# Patient Record
Sex: Male | Born: 1960 | Race: White | Hispanic: No | Marital: Married | Smoking: Never smoker
Health system: Southern US, Community
[De-identification: ages and names within clinical notes are randomized; demographics above are authoritative.]

## PROBLEM LIST (undated history)

## (undated) DIAGNOSIS — K219 Gastro-esophageal reflux disease without esophagitis: Secondary | ICD-10-CM

## (undated) DIAGNOSIS — Z8709 Personal history of other diseases of the respiratory system: Secondary | ICD-10-CM

## (undated) DIAGNOSIS — R112 Nausea with vomiting, unspecified: Secondary | ICD-10-CM

## (undated) DIAGNOSIS — J189 Pneumonia, unspecified organism: Secondary | ICD-10-CM

## (undated) DIAGNOSIS — Z9889 Other specified postprocedural states: Secondary | ICD-10-CM

## (undated) DIAGNOSIS — E119 Type 2 diabetes mellitus without complications: Secondary | ICD-10-CM

## (undated) HISTORY — DX: Type 2 diabetes mellitus without complications: E11.9

## (undated) HISTORY — PX: LYMPH NODE BIOPSY: SHX201

## (undated) HISTORY — PX: POLYPECTOMY: SHX149

## (undated) HISTORY — PX: KNEE ARTHROPLASTY: SHX992

## (undated) HISTORY — PX: UMBILICAL HERNIA REPAIR: SHX196

---

## 2012-05-18 HISTORY — PX: COLONOSCOPY: SHX174

## 2016-10-26 ENCOUNTER — Other Ambulatory Visit (HOSPITAL_COMMUNITY): Payer: Self-pay | Admitting: Urology

## 2016-10-26 DIAGNOSIS — R1909 Other intra-abdominal and pelvic swelling, mass and lump: Secondary | ICD-10-CM

## 2016-11-06 ENCOUNTER — Other Ambulatory Visit: Payer: Self-pay | Admitting: Radiology

## 2016-11-07 ENCOUNTER — Encounter (INDEPENDENT_AMBULATORY_CARE_PROVIDER_SITE_OTHER): Payer: Self-pay

## 2016-11-07 ENCOUNTER — Encounter (HOSPITAL_COMMUNITY): Payer: Self-pay

## 2016-11-07 ENCOUNTER — Ambulatory Visit (HOSPITAL_COMMUNITY)
Admission: RE | Admit: 2016-11-07 | Discharge: 2016-11-07 | Disposition: A | Discharge status: Home / Self Care | Payer: BC Managed Care – PPO | Source: Ambulatory Visit | Attending: Urology | Admitting: Urology

## 2016-11-07 ENCOUNTER — Encounter (HOSPITAL_COMMUNITY): Payer: Self-pay | Admitting: Radiology

## 2016-11-07 DIAGNOSIS — Z79899 Other long term (current) drug therapy: Secondary | ICD-10-CM | POA: Diagnosis not present

## 2016-11-07 DIAGNOSIS — Z7982 Long term (current) use of aspirin: Secondary | ICD-10-CM | POA: Insufficient documentation

## 2016-11-07 DIAGNOSIS — K219 Gastro-esophageal reflux disease without esophagitis: Secondary | ICD-10-CM | POA: Insufficient documentation

## 2016-11-07 DIAGNOSIS — R1909 Other intra-abdominal and pelvic swelling, mass and lump: Secondary | ICD-10-CM | POA: Diagnosis present

## 2016-11-07 DIAGNOSIS — R7989 Other specified abnormal findings of blood chemistry: Secondary | ICD-10-CM | POA: Insufficient documentation

## 2016-11-07 DIAGNOSIS — N2889 Other specified disorders of kidney and ureter: Secondary | ICD-10-CM | POA: Insufficient documentation

## 2016-11-07 DIAGNOSIS — Z7951 Long term (current) use of inhaled steroids: Secondary | ICD-10-CM | POA: Insufficient documentation

## 2016-11-07 HISTORY — DX: Gastro-esophageal reflux disease without esophagitis: K21.9

## 2016-11-07 LAB — BASIC METABOLIC PANEL
ANION GAP: 8 (ref 5–15)
BUN: 21 mg/dL — AB (ref 6–20)
CHLORIDE: 108 mmol/L (ref 101–111)
CO2: 23 mmol/L (ref 22–32)
Calcium: 9.1 mg/dL (ref 8.9–10.3)
Creatinine, Ser: 0.75 mg/dL (ref 0.61–1.24)
GFR calc Af Amer: >60 mL/min (ref >60)
GFR calc non Af Amer: >60 mL/min (ref >60)
Glucose, Bld: 129 mg/dL — ABNORMAL HIGH (ref 65–99)
POTASSIUM: 3.9 mmol/L (ref 3.5–5.1)
SODIUM: 139 mmol/L (ref 135–145)

## 2016-11-07 LAB — CBC WITH DIFFERENTIAL/PLATELET
Basophils Absolute: 0 10*3/uL (ref 0.0–0.1)
Basophils Relative: 1 %
EOS PCT: 3 %
Eosinophils Absolute: 0.1 10*3/uL (ref 0.0–0.7)
HEMATOCRIT: 42.9 % (ref 39.0–52.0)
HEMOGLOBIN: 15.2 g/dL (ref 13.0–17.0)
LYMPHS ABS: 1.7 10*3/uL (ref 0.7–4.0)
LYMPHS PCT: 31 %
MCH: 29.4 pg (ref 26.0–34.0)
MCHC: 35.4 g/dL (ref 30.0–36.0)
MCV: 83 fL (ref 78.0–100.0)
Monocytes Absolute: 0.5 10*3/uL (ref 0.1–1.0)
Monocytes Relative: 10 %
NEUTROS ABS: 3.1 10*3/uL (ref 1.7–7.7)
NEUTROS PCT: 57 %
Platelets: 222 10*3/uL (ref 150–400)
RBC: 5.17 MIL/uL (ref 4.22–5.81)
RDW: 12.8 % (ref 11.5–15.5)
WBC: 5.4 10*3/uL (ref 4.0–10.5)

## 2016-11-07 LAB — PROTIME-INR
INR: 1.03
Prothrombin Time: 13.5 seconds (ref 11.4–15.2)

## 2016-11-07 MED ORDER — FENTANYL CITRATE (PF) 100 MCG/2ML IJ SOLN
INTRAMUSCULAR | Status: AC
  Filled 2016-11-07: qty 4

## 2016-11-07 MED ORDER — MIDAZOLAM HCL 2 MG/2ML IJ SOLN
INTRAMUSCULAR | Status: AC | PRN
  Administered 2016-11-07 (×3): 1 mg via INTRAVENOUS

## 2016-11-07 MED ORDER — FENTANYL CITRATE (PF) 100 MCG/2ML IJ SOLN
INTRAMUSCULAR | Status: AC | PRN
  Administered 2016-11-07: 50 ug via INTRAVENOUS

## 2016-11-07 MED ORDER — MIDAZOLAM HCL 2 MG/2ML IJ SOLN
INTRAMUSCULAR | Status: AC
  Filled 2016-11-07: qty 6

## 2016-11-07 MED ORDER — MIDAZOLAM HCL 2 MG/2ML IJ SOLN
INTRAMUSCULAR | Status: DC | PRN
  Administered 2016-11-07: 1 mg via INTRAVENOUS

## 2016-11-07 MED ORDER — SODIUM CHLORIDE 0.9 % IV SOLN
INTRAVENOUS | Status: DC
  Administered 2016-11-07: 07:00:00 via INTRAVENOUS

## 2016-11-07 NOTE — Procedures (Signed)
Interventional Radiology Procedure Note  Procedure:  CT guided biopsy of left perirenal mass. Mx 18G core biopsy.  Complications: None Recommendations:  - Ok to shower tomorrow - Do not submerge for 7 days - Routine care   Signed,  Dulcy Fanny. Earleen Newport, DO

## 2016-11-07 NOTE — Consult Note (Signed)
Chief Complaint: Patient was seen in consultation today for CT guided left perirenal mass biopsy   Referring Physician(s): Borden,Lester  Supervising Physician: Corrie Mckusick  Patient Status: [facility] - Out-pt  History of Present Illness: Nicholas Pope is a 55 y.o. male with history of GERD and elevated liver function tests who was found to have left perirenal masses incidentally on abdominal ultrasound and confirmed on subsequent MRI of the abdomen. He presents today for CT-guided biopsy of the largest left perirenal mass for or further evaluation. He has no known history of malignancy.  Past Medical History:  Diagnosis Date  . GERD (gastroesophageal reflux disease)   . Left renal mass     Past Surgical History:  Procedure Laterality Date  . KNEE ARTHROPLASTY Left 1980's  . UMBILICAL HERNIA REPAIR     as a child    Allergies: Patient has no known allergies.  Medications: Prior to Admission medications   Medication Sig Start Date End Date Taking? Authorizing Provider  aspirin 325 MG tablet Take 325 mg by mouth daily.   Yes Historical Provider, MD  fluticasone (FLONASE) 50 MCG/ACT nasal spray Place 2 sprays into both nostrils daily.   Yes Historical Provider, MD  folic acid (FOLVITE) A999333 MCG tablet Take 400 mcg by mouth daily.   Yes Historical Provider, MD  loratadine (CLARITIN) 10 MG tablet Take 10 mg by mouth daily.   Yes Historical Provider, MD  Multiple Vitamin (MULTIVITAMIN WITH MINERALS) TABS tablet Take 1 tablet by mouth daily.   Yes Historical Provider, MD  omeprazole (PRILOSEC) 40 MG capsule Take 40 mg by mouth daily.   Yes Historical Provider, MD     History reviewed. No pertinent family history.  Social History   Social History  . Marital status: Married    Spouse name: N/A  . Number of children: N/A  . Years of education: N/A   Social History Main Topics  . Smoking status: Never Smoker  . Smokeless tobacco: Never Used  . Alcohol use No  . Drug use:  No  . Sexual activity: Not Asked   Other Topics Concern  . None   Social History Narrative  . None      Review of Systems CURRENTLY DENIES FEVER, HEADACHE, CHEST PAIN, DYSPNEA, COUGH, ABDOMINAL/BACK PAIN, NAUSEA, VOMITING, DYSURIA/HEMATURIA, WEIGHT LOSS OR NIGHT SWEATS.  Vital Signs: BP (!) 146/90 (BP Location: Left Arm)   Pulse 72   Temp 97.9 F (36.6 C) (Oral)   Resp 16   Ht 6\' 1"  (1.854 m)   Wt 236 lb (107 kg)   SpO2 98%   BMI 31.14 kg/m   Physical Exam patient awake, alert. Chest clear to auscultation bilaterally. Heart with regular rate and rhythm. Abdomen soft, positive bowel sounds, nontender. Lower extremities with intact pulses and no significant edema.  Mallampati Score:     Imaging: No results found.  Labs:  CBC:  Recent Labs  11/07/16 0705  WBC 5.4  HGB 15.2  HCT 42.9  PLT 222    COAGS:  Recent Labs  11/07/16 0705  INR 1.03    BMP:  Recent Labs  11/07/16 0705  NA 139  K 3.9  CL 108  CO2 23  GLUCOSE 129*  BUN 21*  CALCIUM 9.1  CREATININE 0.75  GFRNONAA >60  GFRAA >60    LIVER FUNCTION TESTS: No results for input(s): BILITOT, AST, ALT, ALKPHOS, PROT, ALBUMIN in the last 8760 hours.  TUMOR MARKERS: No results for input(s): AFPTM, CEA, CA199,  [place] in the last 8760 hours.  Assessment and Plan: 55 y.o. male with history of GERD and elevated liver function tests who was found to have left perirenal masses incidentally on abdominal ultrasound and confirmed on subsequent MRI of the abdomen. He presents today for CT-guided biopsy of the largest left perirenal mass for or further evaluation. He has no known history of malignancy.Risks and benefits discussed with the patient/wife including, but not limited to bleeding, infection, damage to adjacent structures or low yield requiring additional tests.All of the patient's questions were answered, patient is agreeable to proceed.Consent signed and in chart. Patient's fasting CBG today  was 129. We'll have him follow up with primary care physician for further evaluation.      Thank you for this interesting consult.  I greatly enjoyed meeting [name] and look forward to participating in their care.  A copy of this report was sent to the requesting provider on this date.  Electronically Signed: D. Rowe Robert 11/07/2016, 8:31 AM   I spent a total of 20 minutes   in face to face in clinical consultation, greater than 50% of which was counseling/coordinating care for CT guided left perirenal mass biopsy

## 2016-11-07 NOTE — Discharge Instructions (Signed)
Needle Biopsy, Care After Introduction These instructions give you information about caring for yourself after your procedure. Your doctor may also give you more specific instructions. Call your doctor if you have any problems or questions after your procedure. Follow these instructions at home:  Rest as told by your doctor.  Take medicines only as told by your doctor.  There are many different ways to close and cover the biopsy site, including stitches (sutures), skin glue, and adhesive strips. Follow instructions from your doctor about:  How to take care of your biopsy site.  When and how you should change your bandage (dressing).  When you should remove your dressing.  Removing whatever was used to close your biopsy site.  Check your biopsy site every day for signs of infection. Watch for:  Redness, swelling, or pain.  Fluid, blood, or pus. Contact a doctor if:  You have a fever.  You have redness, swelling, or pain at the biopsy site, and it lasts longer than a few days.  You have fluid, blood, or pus coming from the biopsy site.  You feel sick to your stomach (nauseous).  You throw up (vomit). Get help right away if:  You are short of breath.  You have trouble breathing.  Your chest hurts.  You feel dizzy or you pass out (faint).  You have bleeding that does not stop with pressure or a bandage.  You cough up blood.  Your belly (abdomen) hurts. This information is not intended to replace advice given to you by your health care provider. Make sure you discuss any questions you have with your health care provider. Document Released: 11/16/2008 Document Revised: 05/11/2016 Document Reviewed: 11/30/2014  2017 Elsevier Moderate Conscious Sedation, Adult Sedation is the use of medicines to promote relaxation and relieve discomfort and anxiety. Moderate conscious sedation is a type of sedation. Under moderate conscious sedation, you are less alert than normal, but  you are still able to respond to instructions, touch, or both. Moderate conscious sedation is used during short medical and dental procedures. It is milder than deep sedation, which is a type of sedation under which you cannot be easily woken up. It is also milder than general anesthesia, which is the use of medicines to make you unconscious. Moderate conscious sedation allows you to return to your regular activities sooner. Tell a health care provider about:  Any allergies you have.  All medicines you are taking, including vitamins, herbs, eye drops, creams, and over-the-counter medicines.  Use of steroids (by mouth or creams).  Any problems you or family members have had with sedatives and anesthetic medicines.  Any blood disorders you have.  Any surgeries you have had.  Any medical conditions you have, such as sleep apnea.  Whether you are pregnant or may be pregnant.  Any use of cigarettes, alcohol, marijuana, or street drugs. What are the risks? Generally, this is a safe procedure. However, problems may occur, including:  Getting too much medicine (oversedation).  Nausea.  Allergic reaction to medicines.  Trouble breathing. If this happens, a breathing tube may be used to help with breathing. It will be removed when you are awake and breathing on your own.  Heart trouble.  Lung trouble. What happens before the procedure? Staying hydrated  Follow instructions from your health care provider about hydration, which may include:  Up to 2 hours before the procedure - you may continue to drink clear liquids, such as water, clear fruit juice, black coffee, and plain tea.  Eating and drinking restrictions  Follow instructions from your health care provider about eating and drinking, which may include:  8 hours before the procedure - stop eating heavy meals or foods such as meat, fried foods, or fatty foods.  6 hours before the procedure - stop eating light meals or foods, such  as toast or cereal.  6 hours before the procedure - stop drinking milk or drinks that contain milk.  2 hours before the procedure - stop drinking clear liquids. Medicine  Ask your health care provider about:  Changing or stopping your regular medicines. This is especially important if you are taking diabetes medicines or blood thinners.  Taking medicines such as aspirin and ibuprofen. These medicines can thin your blood. Do not take these medicines before your procedure if your health care provider instructs you not to. Tests and exams  You will have a physical exam.  You may have blood tests done to show:  How well your kidneys and liver are working.  How well your blood can clot. General instructions  Plan to have someone take you home from the hospital or clinic.  If you will be going home right after the procedure, plan to have someone with you for 24 hours. What happens during the procedure?  An IV tube will be inserted into one of your veins.  Medicine to help you relax (sedative) will be given through the IV tube.  The medical or dental procedure will be performed. What happens after the procedure?  Your blood pressure, heart rate, breathing rate, and blood oxygen level will be monitored often until the medicines you were given have worn off.  Do not drive for 24 hours. This information is not intended to replace advice given to you by your health care provider. Make sure you discuss any questions you have with your health care provider. Document Released: 08/29/2001 Document Revised: 05/09/2016 Document Reviewed: 03/25/2016 Elsevier Interactive Patient Education  2017 [employer].

## 2016-11-22 ENCOUNTER — Telehealth: Payer: Self-pay | Admitting: *Deleted

## 2016-11-22 NOTE — Telephone Encounter (Signed)
"  This is Nicholas Pope calling for my husband Asaun Brester.  Dr. Alinda Money with [facility] referred him to see a Dr. Alen Blew over a week ago and we have not heard anything yet."  No referral received yet per new patient coordinator.  Advised to contact referring provider.

## 2016-11-23 ENCOUNTER — Telehealth: Payer: Self-pay | Admitting: Oncology

## 2016-11-23 ENCOUNTER — Ambulatory Visit (HOSPITAL_BASED_OUTPATIENT_CLINIC_OR_DEPARTMENT_OTHER): Payer: BC Managed Care – PPO | Admitting: Oncology

## 2016-11-23 VITALS — BP 142/91 | HR 80 | Temp 97.9°F | Resp 16 | Ht 73.0 in | Wt 244.8 lb

## 2016-11-23 DIAGNOSIS — C8590 Non-Hodgkin lymphoma, unspecified, unspecified site: Secondary | ICD-10-CM | POA: Diagnosis not present

## 2016-11-23 NOTE — Progress Notes (Signed)
Reason for Referral: Perinephric mass. Lymphoma.   HPI: 55 year old gentleman without any significant comorbid conditions other than hyperlipidemia. He was noted to have elevated liver enzymes on a routine physical by his primary care physician. Based on these findings he underwent an ultrasound of the abdomen on 10/09/2016. The ultrasound showed fatty infiltration of the liver as well as a 1.1 x 1.0 x 1.2 cm complex cyst in the left kidney and MRI was recommended. MRI of the abdomen was obtained on 10/16/2016 which showed a 10 mm cyst along the left kidney there is also 1.7 x 2.3 enhancing perirenal soft tissue lesion along the lateral interpolar aspect of the left kidney. 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 He was evaluated by Dr. Alinda Money and a biopsy was obtained on 11/07/2016. CT-guided biopsy showed atypical lymphoid proliferation that is suspicious for lymphoma. Immunohistochemical stains performed which showed B cells that stain for CD20 and CD79a. There is no core expression of CD5. CD10 patchy positivity. No cyclin D positivity noted. 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 But the material is limited and did not show definitive monoclonality by flow cytometry. Based on these findings patient referred to me for evaluation.  Clinically, he is completely asymptomatic. He denied any fevers or chills. He denied any early satiety or abdominal pain. He denied any constitutional symptoms of fatigue or tiredness. He remains very active and continues to work full time.  He does not report any headaches, blurry vision, syncope or seizures. He does not report any fevers, chills, sweats or weight loss. He does not report any chest pain, palpitation, orthopnea or leg edema. He does not report any cough, wheezing or hemoptysis. He is not reporting nausea, vomiting, abdominal pain, constipation, diarrhea or change in his bowel habits. He does  not report any frequency, urgency or hesitancy. He does report very mild nocturia. He does not report any lymphadenopathy or petechiae. He does not report any skeletal complaints. Remaining review of systems unremarkable.   Past Medical History:  Diagnosis Date  . GERD (gastroesophageal reflux disease)   :  Past Surgical History:  Procedure Laterality Date  . KNEE ARTHROPLASTY Left 1980's  . UMBILICAL HERNIA REPAIR     as a child  :   Current Outpatient Prescriptions:  .  aspirin 325 MG tablet, Take 325 mg by mouth daily., Disp: , Rfl:  .  fluticasone (FLONASE) 50 MCG/ACT nasal spray, Place 2 sprays into both nostrils daily., Disp: , Rfl:  .  folic acid (FOLVITE) A999333 MCG tablet, Take 400 mcg by mouth daily., Disp: , Rfl:  .  loratadine (CLARITIN) 10 MG tablet, Take 10 mg by mouth daily., Disp: , Rfl:  .  Multiple Vitamin (MULTIVITAMIN WITH MINERALS) TABS tablet, Take 1 tablet by mouth daily., Disp: , Rfl:  .  omeprazole (PRILOSEC) 40 MG capsule, Take 40 mg by mouth daily., Disp: , Rfl: :  No Known Allergies:  No family history on file.:  Social History   Social History  . Marital status: Married    Spouse name: N/A  . Number of children: N/A  . Years of education: N/A   Occupational History  . Not on file.   Social History Main Topics  . Smoking status: Never Smoker  . Smokeless tobacco: Never Used  . Alcohol use No  . Drug use: No  . Sexual activity: Not on file   Other Topics Concern  . Not on file   Social History  Narrative  . No narrative on file  :  Pertinent items are noted in HPI.  Exam: Blood pressure (!) 142/91, pulse 80, temperature 97.9 F (36.6 C), temperature source Oral, resp. rate 16, height 6\' 1"  (1.854 m), weight 244 lb 12.8 oz (111 kg), SpO2 100 %. General appearance: alert and cooperative Head: Normocephalic, without obvious abnormality Neck: no adenopathy Back: negative Resp: clear to auscultation bilaterally Chest wall: no  tenderness Cardio: regular rate and rhythm, S1, S2 normal, no murmur, click, rub or gallop GI: soft, non-tender; bowel sounds normal; no masses,  no organomegaly Extremities: extremities normal, atraumatic, no cyanosis or edema Pulses: 2+ and symmetric  CBC    Component Value Date/Time   WBC 5.4 11/07/2016 0705   RBC 5.17 11/07/2016 0705   HGB 15.2 11/07/2016 0705   HCT 42.9 11/07/2016 0705   PLT 222 11/07/2016 0705   MCV 83.0 11/07/2016 0705   MCH 29.4 11/07/2016 0705   MCHC 35.4 11/07/2016 0705   RDW 12.8 11/07/2016 0705   LYMPHSABS 1.7 11/07/2016 0705   MONOABS 0.5 11/07/2016 0705   EOSABS 0.1 11/07/2016 0705   BASOSABS 0.0 11/07/2016 0705     Chemistry      Component Value Date/Time   NA 139 11/07/2016 0705   K 3.9 11/07/2016 0705   CL 108 11/07/2016 0705   CO2 23 11/07/2016 0705   BUN 21 (H) 11/07/2016 0705   CREATININE 0.75 11/07/2016 0705      Component Value Date/Time   CALCIUM 9.1 11/07/2016 0705         Assessment and Plan:    55 year old woman with the following issues:  1. 1.7 x 2.3 enhancing perirenal soft tissue lesion diagnosed in October 2017. He is status post biopsy done on 11/07/2016 which showed atypical lymphoid proliferation suspicious for lymphoma. The immunohistochemical staining suggest marginal zone lymphoma although there is no monoclonality by flow cytometry. Or tissue is recommended to evaluate the process.  These findings were discussed with the patient today in detail with his family. The differential diagnosis would include reactive process. Reactive lymphoid proliferation versus a neoplastic process such as lymphoma. High-grade lymphoma is considered extremely unlikely given the lack of symptoms and no evidence of any abnormalities noted on his imaging studies.  From a management standpoint, I have recommended a full body imaging studies including a PET scan to complete the staging process. Ideally, I would like to obtain a tissue  biopsy for additional characterization of this process. That would potentially include an excisional lymph node biopsy if his PET scan show any additional lymphadenopathy. Alternatively, surgical removal of the perinephritic mass could also be considered as an option if no other lesions are noted. Observation and surveillance and radiology surveillance could also be an option if he remains completely asymptomatic and no other lesions are noted at that time. He will follow up after his PET scan to discuss the results and the next steps of management.  2. Increase in his liver function tests: This believes to be unrelated to his perinephric mass and likely related to statin drugs. Repeat liver function tests will be needed in the future to ensure that. He has discontinued statin at this time.  3. Follow-up: Will be in the next couple weeks after imaging studies.

## 2016-11-23 NOTE — Telephone Encounter (Signed)
Left message re 12/21 f/u. Central radiology will call re scan. Schedule mailed.

## 2016-12-05 ENCOUNTER — Ambulatory Visit (HOSPITAL_COMMUNITY)
Admission: RE | Admit: 2016-12-05 | Discharge: 2016-12-05 | Disposition: A | Discharge status: Home / Self Care | Payer: BC Managed Care – PPO | Source: Ambulatory Visit | Attending: Oncology | Admitting: Oncology

## 2016-12-05 DIAGNOSIS — C8590 Non-Hodgkin lymphoma, unspecified, unspecified site: Secondary | ICD-10-CM | POA: Insufficient documentation

## 2016-12-05 DIAGNOSIS — R911 Solitary pulmonary nodule: Secondary | ICD-10-CM | POA: Diagnosis not present

## 2016-12-05 LAB — GLUCOSE, CAPILLARY: Glucose-Capillary: 116 mg/dL — ABNORMAL HIGH (ref 65–99)

## 2016-12-05 MED ORDER — FLUDEOXYGLUCOSE F - 18 (FDG) INJECTION
12.0400 | Freq: Once | INTRAVENOUS | Status: AC | PRN
  Administered 2016-12-05: 12.04 via INTRAVENOUS

## 2016-12-07 ENCOUNTER — Ambulatory Visit (HOSPITAL_BASED_OUTPATIENT_CLINIC_OR_DEPARTMENT_OTHER): Payer: BC Managed Care – PPO | Admitting: Oncology

## 2016-12-07 ENCOUNTER — Telehealth: Payer: Self-pay | Admitting: Oncology

## 2016-12-07 VITALS — BP 164/86 | HR 89 | Temp 97.8°F | Resp 18 | Ht 73.0 in | Wt 242.8 lb

## 2016-12-07 DIAGNOSIS — N2889 Other specified disorders of kidney and ureter: Secondary | ICD-10-CM | POA: Diagnosis not present

## 2016-12-07 NOTE — Telephone Encounter (Signed)
Gave patient avs report and appointments for January  °

## 2016-12-07 NOTE — Progress Notes (Signed)
Hematology and Oncology Follow Up Visit  Nicholas Pope [id number] November 30, 1961 55 y.o. 12/07/2016 4:32 PM Nicholas Pope, MDBurdine, Nicholas Evener, MD   Principle Diagnosis: 56 year old gentleman with 1.7 x 2.3 cm mass in the pararenal space detected in October 2017. Workup remains ongoing.   Prior Therapy: Status post needle biopsy done on 11/07/2016 which showed atypical lymphoid proliferation suspicious for lymphoma. The immunohistochemical staining suggest marginal zone lymphoma although there is no monoclonality by flow cytometry. More tissue is recommended to evaluate the process.  Current therapy: Under evaluation for further therapy.  Interim History: Nicholas Pope presents today for a follow-up visit. Since the last visit, he underwent a PET scan and has not reported any changes in his health. He denied any fevers or chills or sweats. He does not report any cough or abdominal pain. He denied any flank pain or hematuria. He does not report any changes in his performance status or activity level. He is completely asymptomatic at this time.  He does not report any headaches, blurry vision, syncope or seizures. He does not report any fevers, chills, sweats or weight loss. He does not report any chest pain, palpitation, orthopnea or leg edema. He does not report any cough, wheezing or hemoptysis. He is not reporting nausea, vomiting, abdominal pain, constipation, diarrhea or change in his bowel habits. He does not report any frequency, urgency or hesitancy. He does report very mild nocturia. He does not report any lymphadenopathy or petechiae. He does not report any skeletal complaints. Remaining review of systems unremarkable.   Medications: I have reviewed the patient's current medications.  Current Outpatient Prescriptions  Medication Sig Dispense Refill  . aspirin 325 MG tablet Take 325 mg by mouth daily.    . fluticasone (FLONASE) 50 MCG/ACT nasal spray Place 2 sprays into both nostrils  daily.    . folic acid (FOLVITE) A999333 MCG tablet Take 400 mcg by mouth daily.    Marland Kitchen loratadine (CLARITIN) 10 MG tablet Take 10 mg by mouth daily.    . Multiple Vitamin (MULTIVITAMIN WITH MINERALS) TABS tablet Take 1 tablet by mouth daily.    Marland Kitchen omeprazole (PRILOSEC) 40 MG capsule Take 40 mg by mouth daily.     No current facility-administered medications for this visit.      Allergies: No Known Allergies  Past Medical History, Surgical history, Social history, and Family History were reviewed and updated.   Physical Exam: Blood pressure (!) 164/86, pulse 89, temperature 97.8 F (36.6 C), temperature source Oral, resp. rate 18, height 6\' 1"  (1.854 m), weight 242 lb 12.8 oz (110.1 kg), SpO2 97 %. ECOG: 0 General appearance: alert and cooperative Head: Normocephalic, without obvious abnormality Neck: no adenopathy Lymph nodes: Cervical, supraclavicular, and axillary nodes normal. Heart:regular rate and rhythm, S1, S2 normal, no murmur, click, rub or gallop Lung:chest clear, no wheezing, rales, normal symmetric air entry Abdomin: soft, non-tender, without masses or organomegaly EXT:no erythema, induration, or nodules   Lab Results: Lab Results  Component Value Date   WBC 5.4 11/07/2016   HGB 15.2 11/07/2016   HCT 42.9 11/07/2016   MCV 83.0 11/07/2016   PLT 222 11/07/2016     Chemistry      Component Value Date/Time   NA 139 11/07/2016 0705   K 3.9 11/07/2016 0705   CL 108 11/07/2016 0705   CO2 23 11/07/2016 0705   BUN 21 (H) 11/07/2016 0705   CREATININE 0.75 11/07/2016 0705      Component Value Date/Time   CALCIUM  9.1 11/07/2016 0705     EXAM: NUCLEAR MEDICINE PET SKULL BASE TO THIGH  TECHNIQUE: 6.5 mCi F-18 FDG was injected intravenously. Full-ring PET imaging was performed from the skull base to thigh after the radiotracer. CT data was obtained and used for attenuation correction and anatomic localization.  FASTING BLOOD GLUCOSE:  Value: 116  mg/dl  COMPARISON:  MRI 10/16/2016  FINDINGS: NECK  No hypermetabolic lymph nodes in the neck.  CHEST  LEFT lower lobe 4 mm subpleural nodule (image 49, series 7). No associated metabolic activity. No hypermetabolic or enlarged mediastinal or axillary lymph nodes.  ABDOMEN/PELVIS  The soft tissue in the LEFT perirenal space is intensely metabolic with SUV max equal 8.3. Nodular lesion measures 18 x 17 mm which is not changed in size from 18 by 18 mm on comparison exam. There is a second hypermetabolic nodule inferior to the lower pole of the LEFT kidney measuring 12 mm (image 142, series 4). This lesion is also hypermetabolic with SUV max equal 4.8.  Small lesion in the RIGHT pararenal space measures 6 mm (141, series 4) without associated metabolic activity.  No additional all abnormal metabolic activity the retroperitoneum. No hypermetabolic retroperitoneal or iliac or inguinal lymph nodes.  No abnormal activity in the spleen, adrenal glands or liver.  SKELETON  No focal hypermetabolic activity to suggest skeletal metastasis.  IMPRESSION: 1. Hypermetabolic soft tissue in the left perirenal space is consistent with lymphoma. 2. Second small nodule in the retroperitoneum inferior to the lower pole of the LEFT kidney is consistent with lymphoma. 3. Small round lesion adjacent to the RIGHT kidney is not hypermetabolic. Recommend attention on follow-up. 4. Small LEFT lower lobe pulmonary nodule is likely benign. Attention on follow-up. 5. No hypermetabolic or enlarged abdominal, iliac or pelvic lymph nodes. No thoracic adenopathy or neck adenopathy.  Impression and Plan:   55 year old woman with the following issues:  1. 1.7 x 2.3 enhancing perirenal soft tissue lesion diagnosed in October 2017. He is status post biopsy done on 11/07/2016 which showed atypical lymphoid proliferation suspicious for lymphoma. The immunohistochemical staining suggest  marginal zone lymphoma although there is no monoclonality by flow cytometry.   His PET/CT scan didn't show hypermetabolic soft tissue mass and the left perirenal space with a second small nodule that are suspicious for lymphoma. No other lymphadenopathy noted anywhere else.  Options of therapy were reviewed today which include continued observation and surveillance and repeat imaging studies in 3-6 months versus obtaining more tissues. These findings are highly suspicious for lymphoma although the needle biopsy was inadequate to fully characterize this process. In order to get more tissue, he will require an excisional biopsy. In order to do so, I will need to refer him to Dr. Alinda Money for a laparoscopic excisional biopsy at that time.  After discussing the risks and benefits of both approaches he is in favor of obtaining tissue in the immediate future. I will refer him to Dr. Alinda Money for evaluation to hopefully to have that done in the next few weeks.  Once this process is fully identified and characterized treatment options will be outlined at that point.  2. Follow-up: Will be next few weeks after tissue biopsy obtained.   Zola Button, MD 12/21/20174:32 PM

## 2016-12-14 ENCOUNTER — Encounter: Payer: Self-pay | Admitting: Oncology

## 2016-12-15 ENCOUNTER — Telehealth: Payer: Self-pay | Admitting: Oncology

## 2016-12-15 NOTE — Telephone Encounter (Signed)
Faxed office note to [facility]

## 2016-12-18 DIAGNOSIS — C801 Malignant (primary) neoplasm, unspecified: Secondary | ICD-10-CM

## 2016-12-18 HISTORY — DX: Malignant (primary) neoplasm, unspecified: C80.1

## 2016-12-19 ENCOUNTER — Other Ambulatory Visit: Payer: Self-pay | Admitting: Urology

## 2016-12-19 NOTE — Progress Notes (Signed)
Scheduling pre op--please PLACE SURGICAL ORDERS IN EPIC  THANKS

## 2016-12-20 ENCOUNTER — Other Ambulatory Visit: Payer: Self-pay | Admitting: Urology

## 2016-12-29 NOTE — Patient Instructions (Signed)
Nicholas Pope  12/29/2016   Your procedure is scheduled on: 01/08/2017    Report to [facility]  Entrance take [place]  elevators to 3rd floor to  [place] at   [place] AM.  Call this number if you have problems the morning of surgery [phone]   Remember: ONLY 1 PERSON MAY GO WITH YOU TO SHORT STAY TO GET  READY MORNING OF YOUR SURGERY.  Do not eat food or drink liquids :After Midnight.     Take these medicines the morning of surgery with A SIP OF WATER: Flonase, Claritin                                 You may not have any metal on your body including hair pins and              piercings  Do not wear jewelry,  lotions, powders or perfumes, deodorant                        Men may shave face and neck.   Do not bring valuables to the hospital. [place].  Contacts, dentures or bridgework may not be worn into surgery.  Leave suitcase in the car. After surgery it may be brought to your room.       Special Instructions: N/A              Please read over the following fact sheets you were given: _____________________________________________________________________                CLEAR LIQUID DIET   Foods Allowed                                                                     Foods Excluded  Coffee and tea, regular and decaf                             liquids that you cannot  Plain Jell-O in any flavor                                             see through such as: Fruit ices (not with fruit pulp)                                     milk, soups, orange juice  Iced Popsicles                                    All solid food Carbonated beverages, regular and diet  Cranberry, grape and apple juices Sports drinks like Gatorade Lightly seasoned clear broth or consume(fat free) Sugar, honey syrup  Sample Menu Breakfast                                 Lunch                                     Supper Cranberry juice                    Beef broth                            Chicken broth Jell-O                                     Grape juice                           Apple juice Coffee or tea                        Jell-O                                      Popsicle                                                Coffee or tea                        Coffee or tea  _____________________________________________________________________  [facility] - Preparing for Surgery Before surgery, you can play an important role.  Because skin is not sterile, your skin needs to be as free of germs as possible.  You can reduce the number of germs on your skin by washing with CHG (chlorahexidine gluconate) soap before surgery.  CHG is an antiseptic cleaner which kills germs and bonds with the skin to continue killing germs even after washing. Please DO NOT use if you have an allergy to CHG or antibacterial soaps.  If your skin becomes reddened/irritated stop using the CHG and inform your nurse when you arrive at Short Stay. Do not shave (including legs and underarms) for at least 48 hours prior to the first CHG shower.  You may shave your face/neck. Please follow these instructions carefully:  1.  Shower with CHG Soap the night before surgery and the  morning of Surgery.  2.  If you choose to wash your hair, wash your hair first as usual with your  normal  shampoo.  3.  After you shampoo, rinse your hair and body thoroughly to remove the  shampoo.                           4.  Use CHG as you would any other liquid soap.  You can apply chg directly  to the skin and wash  Gently with a scrungie or clean washcloth.  5.  Apply the CHG Soap to your body ONLY FROM THE NECK DOWN.   Do not use on face/ open                           Wound or open sores. Avoid contact with eyes, ears mouth and genitals (private parts).                       Wash face,   Genitals (private parts) with your normal soap.             6.  Wash thoroughly, paying special attention to the area where your surgery  will be performed.  7.  Thoroughly rinse your body with warm water from the neck down.  8.  DO NOT shower/wash with your normal soap after using and rinsing off  the CHG Soap.                9.  Pat yourself dry with a clean towel.            10.  Wear clean pajamas.            11.  Place clean sheets on your bed the night of your first shower and do not  sleep with pets. Day of Surgery : Do not apply any lotions/deodorants the morning of surgery.  Please wear clean clothes to the hospital/surgery center.  FAILURE TO FOLLOW THESE INSTRUCTIONS MAY RESULT IN THE CANCELLATION OF YOUR SURGERY PATIENT SIGNATURE_________________________________  NURSE SIGNATURE__________________________________  ________________________________________________________________________  WHAT IS A BLOOD TRANSFUSION? Blood Transfusion Information  A transfusion is the replacement of blood or some of its parts. Blood is made up of multiple cells which provide different functions.  Red blood cells carry oxygen and are used for blood loss replacement.  White blood cells fight against infection.  Platelets control bleeding.  Plasma helps clot blood.  Other blood products are available for specialized needs, such as hemophilia or other clotting disorders. BEFORE THE TRANSFUSION  Who gives blood for transfusions?   Healthy volunteers who are fully evaluated to make sure their blood is safe. This is blood bank blood. Transfusion therapy is the safest it has ever been in the practice of medicine. Before blood is taken from a donor, a complete history is taken to make sure that person has no history of diseases nor engages in risky social behavior (examples are intravenous drug use or sexual activity with multiple partners). The donor's travel history is screened to minimize risk  of transmitting infections, such as malaria. The donated blood is tested for signs of infectious diseases, such as HIV and hepatitis. The blood is then tested to be sure it is compatible with you in order to minimize the chance of a transfusion reaction. If you or a relative donates blood, this is often done in anticipation of surgery and is not appropriate for emergency situations. It takes many days to process the donated blood. RISKS AND COMPLICATIONS Although transfusion therapy is very safe and saves many lives, the main dangers of transfusion include:   Getting an infectious disease.  Developing a transfusion reaction. This is an allergic reaction to something in the blood you were given. Every precaution is taken to prevent this. The decision to have a blood transfusion has been considered carefully by your caregiver before blood is given. Blood is not given unless the benefits  outweigh the risks. AFTER THE TRANSFUSION  Right after receiving a blood transfusion, you will usually feel much better and more energetic. This is especially true if your red blood cells have gotten low (anemic). The transfusion raises the level of the red blood cells which carry oxygen, and this usually causes an energy increase.  The nurse administering the transfusion will monitor you carefully for complications. HOME CARE INSTRUCTIONS  No special instructions are needed after a transfusion. You may find your energy is better. Speak with your caregiver about any limitations on activity for underlying diseases you may have. SEEK MEDICAL CARE IF:   Your condition is not improving after your transfusion.  You develop redness or irritation at the intravenous (IV) site. SEEK IMMEDIATE MEDICAL CARE IF:  Any of the following symptoms occur over the next 12 hours:  Shaking chills.  You have a temperature by mouth above 102 F (38.9 C), not controlled by medicine.  Chest, back, or muscle pain.  People around  you feel you are not acting correctly or are confused.  Shortness of breath or difficulty breathing.  Dizziness and fainting.  You get a rash or develop hives.  You have a decrease in urine output.  Your urine turns a dark color or changes to pink, red, or brown. Any of the following symptoms occur over the next 10 days:  You have a temperature by mouth above 102 F (38.9 C), not controlled by medicine.  Shortness of breath.  Weakness after normal activity.  The white part of the eye turns yellow (jaundice).  You have a decrease in the amount of urine or are urinating less often.  Your urine turns a dark color or changes to pink, red, or brown. Document Released: 12/01/2000 Document Revised: 02/26/2012 Document Reviewed: 07/20/2008 ExitCare Patient Information 2014 [place].  _______________________________________________________________________  Incentive Spirometer  An incentive spirometer is a tool that can help keep your lungs clear and active. This tool measures how well you are filling your lungs with each breath. Taking long deep breaths may help reverse or decrease the chance of developing breathing (pulmonary) problems (especially infection) following:  A long period of time when you are unable to move or be active. BEFORE THE PROCEDURE   If the spirometer includes an indicator to show your best effort, your nurse or respiratory therapist will set it to a desired goal.  If possible, sit up straight or lean slightly forward. Try not to slouch.  Hold the incentive spirometer in an upright position. INSTRUCTIONS FOR USE  1. Sit on the edge of your bed if possible, or sit up as far as you can in bed or on a chair. 2. Hold the incentive spirometer in an upright position. 3. Breathe out normally. 4. Place the mouthpiece in your mouth and seal your lips tightly around it. 5. Breathe in slowly and as deeply as possible, raising the piston or the ball toward the  top of the column. 6. Hold your breath for 3-5 seconds or for as long as possible. Allow the piston or ball to fall to the bottom of the column. 7. Remove the mouthpiece from your mouth and breathe out normally. 8. Rest for a few seconds and repeat Steps 1 through 7 at least 10 times every 1-2 hours when you are awake. Take your time and take a few normal breaths between deep breaths. 9. The spirometer may include an indicator to show your best effort. Use the indicator as a goal to  work toward during each repetition. 10. After each set of 10 deep breaths, practice coughing to be sure your lungs are clear. If you have an incision (the cut made at the time of surgery), support your incision when coughing by placing a pillow or rolled up towels firmly against it. Once you are able to get out of bed, walk around indoors and cough well. You may stop using the incentive spirometer when instructed by your caregiver.  RISKS AND COMPLICATIONS  Take your time so you do not get dizzy or light-headed.  If you are in pain, you may need to take or ask for pain medication before doing incentive spirometry. It is harder to take a deep breath if you are having pain. AFTER USE  Rest and breathe slowly and easily.  It can be helpful to keep track of a log of your progress. Your caregiver can provide you with a simple table to help with this. If you are using the spirometer at home, follow these instructions: [place] IF:   You are having difficultly using the spirometer.  You have trouble using the spirometer as often as instructed.  Your pain medication is not giving enough relief while using the spirometer.  You develop fever of 100.5 F (38.1 C) or higher. SEEK IMMEDIATE MEDICAL CARE IF:   You cough up bloody sputum that had not been present before.  You develop fever of 102 F (38.9 C) or greater.  You develop worsening pain at or near the incision site. MAKE SURE YOU:   Understand  these instructions.  Will watch your condition.  Will get help right away if you are not doing well or get worse. Document Released: 04/16/2007 Document Revised: 02/26/2012 Document Reviewed: 06/17/2007 [facility] Patient Information 2014 [place], [place].   ________________________________________________________________________

## 2017-01-01 ENCOUNTER — Encounter (HOSPITAL_COMMUNITY)
Admission: RE | Admit: 2017-01-01 | Discharge: 2017-01-01 | Disposition: A | Discharge status: Home / Self Care | Payer: BC Managed Care – PPO | Source: Ambulatory Visit | Attending: Urology | Admitting: Urology

## 2017-01-01 ENCOUNTER — Encounter (HOSPITAL_COMMUNITY): Payer: Self-pay

## 2017-01-01 ENCOUNTER — Ambulatory Visit (HOSPITAL_COMMUNITY)
Admission: RE | Admit: 2017-01-01 | Discharge: 2017-01-01 | Disposition: A | Discharge status: Home / Self Care | Payer: BC Managed Care – PPO | Source: Ambulatory Visit | Attending: Urology | Admitting: Urology

## 2017-01-01 DIAGNOSIS — Z01818 Encounter for other preprocedural examination: Secondary | ICD-10-CM

## 2017-01-01 DIAGNOSIS — N2889 Other specified disorders of kidney and ureter: Secondary | ICD-10-CM | POA: Insufficient documentation

## 2017-01-01 DIAGNOSIS — Z01812 Encounter for preprocedural laboratory examination: Secondary | ICD-10-CM | POA: Insufficient documentation

## 2017-01-01 DIAGNOSIS — Z01810 Encounter for preprocedural cardiovascular examination: Secondary | ICD-10-CM | POA: Diagnosis present

## 2017-01-01 HISTORY — DX: Other specified postprocedural states: Z98.890

## 2017-01-01 HISTORY — DX: Pneumonia, unspecified organism: J18.9

## 2017-01-01 HISTORY — DX: Nausea with vomiting, unspecified: R11.2

## 2017-01-01 LAB — BASIC METABOLIC PANEL
ANION GAP: 8 (ref 5–15)
BUN: 14 mg/dL (ref 6–20)
CALCIUM: 8.8 mg/dL — AB (ref 8.9–10.3)
CO2: 24 mmol/L (ref 22–32)
CREATININE: 0.75 mg/dL (ref 0.61–1.24)
Chloride: 106 mmol/L (ref 101–111)
GLUCOSE: 124 mg/dL — AB (ref 65–99)
Potassium: 4.4 mmol/L (ref 3.5–5.1)
Sodium: 138 mmol/L (ref 135–145)

## 2017-01-01 LAB — CBC
HCT: 42.8 % (ref 39.0–52.0)
HEMOGLOBIN: 14.9 g/dL (ref 13.0–17.0)
MCH: 29 pg (ref 26.0–34.0)
MCHC: 34.8 g/dL (ref 30.0–36.0)
MCV: 83.4 fL (ref 78.0–100.0)
PLATELETS: 216 10*3/uL (ref 150–400)
RBC: 5.13 MIL/uL (ref 4.22–5.81)
RDW: 12.8 % (ref 11.5–15.5)
WBC: 5.3 10*3/uL (ref 4.0–10.5)

## 2017-01-01 LAB — ABO/RH: ABO/RH(D): O POS

## 2017-01-04 NOTE — H&P (Signed)
Office Visit Report     12/29/2016   --------------------------------------------------------------------------------   Nicholas Pope  MRN: [id number]  PRIMARY CARE:    DOB: 10/09/1961, 56 year old Male  REFERRING:  Judd Lien, MD  SSN: -[id number] [id number]  PROVIDER:  Raynelle Bring, M.D.    LOCATION:  [facility] [phone]   --------------------------------------------------------------------------------   CC/HPI: Left perirenal mass   Nicholas Pope follows up today in preparation for undergoing a surgical excisional biopsy of his left perirenal mass. 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 However, the tissue was insufficient to make a definitive diagnosis and to direct therapy. I have therefore been requested by Dr. Alen Blew to proceed with an excisional biopsy to aid him in making a definitive diagnosis. Nicholas Pope remains in excellent health and denies any new health problems since his last visit.     ALLERGIES: No Known Drug Allergies    MEDICATIONS: Aspirin 325 mg tablet  Omeprazole 40 mg capsule,delayed release  Claritin 10 mg tablet  Fluticasone Propionate 50 mcg/actuation spray, suspension  Folic Acid  Multivitamins     GU PSH: None     PSH Notes: knee surgery   NON-GU PSH: Hernia Repair    GU PMH: None     PMH Notes:   1) Left peri-renal mass: Nicholas Pope is a 56 year old gentleman who was initially seen in consultation in November 2017 at the request of Dr. Judd Lien for a left perirenal mass. He was found to have elevated liver function tests during a routine physical exam and underwent an ultrasound of the abdomen for further evaluation. This incidentally detected a complex left renal mass in the upper pole of the left kidney measuring 1 cm. For further evaluation, he underwent a dedicated MRI of the abdomen with and without IV contrast. This confirmed the upper pole left renal mass  to be consistent with a simple renal cyst. However, he was also noted to have an enhancing 2.3 x 1.7 cm mass in the left perirenal space. In addition, there was a separate 8 mm more superiorly located soft tissue nodule in the perinephric space as well. There does appear to be a clear plane between this mass and the renal capsule on MR suggesting that this is not a primary renal mass. The mass also has fairly non-distinct borders which would be atypical for a primary renal malignancy. He underwent a percutaneous biopsy that demonstrated findings for probable lymphoma but were inconclusive. He was seen by Dr. Alen Blew and he recommended an excisional biopsy.   He has no family history of kidney cancer. He has no personal history of GU malignancy. He denies any hematuria, history of UTI/STDs, GU malignancy/trauma/surgery.    NON-GU PMH: Other intra-abdominal and pelvic swelling, mass and lump - 10/26/2016 GERD Hypercholesterolemia    FAMILY HISTORY: 1 son - Other Death of family member - Mother Hematuria - Mother, Father Kidney Stones - Mother Prostate disorder - Father   SOCIAL HISTORY: Marital Status: Married Current Smoking Status: Patient has never smoked.  Uses smokeless tobacco. Has never drank.  Drinks 1 caffeinated drink per day. 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    REVIEW OF SYSTEMS:    GU Review Male:   Patient reports get up at night to urinate. Patient denies hard to postpone urination, stream starts and stops, burning/ pain with urination, frequent urination, trouble starting your streams, have to strain to urinate , and leakage of urine.  Gastrointestinal (Lower):   Patient denies diarrhea and constipation.  Gastrointestinal (Upper):   Patient denies nausea and vomiting.  Constitutional:   Patient denies fever, night sweats, weight loss, and fatigue.  Skin:   Patient denies skin rash/ lesion and itching.  Eyes:   Patient denies blurred vision and double vision.  Ears/  Nose/ Throat:   Patient denies sore throat and sinus problems.  Hematologic/Lymphatic:   Patient denies swollen glands and easy bruising.  Cardiovascular:   Patient denies leg swelling and chest pains.  Respiratory:   Patient denies cough and shortness of breath.  Endocrine:   Patient denies excessive thirst.  Musculoskeletal:   Patient denies back pain and joint pain.  Neurological:   Patient denies headaches and dizziness.  Psychologic:   Patient denies depression and anxiety.   VITAL SIGNS:      12/29/2016 04:13 PM  Weight 240 lb / 108.86 kg  Height 73 in / 185.42 cm  BP 130/86 mmHg  Pulse 102 /min  BMI 31.7 kg/m   MULTI-SYSTEM PHYSICAL EXAMINATION:    Constitutional: Well-nourished. No physical deformities. Normally developed. Good grooming.  Neck: Neck symmetrical, not swollen. Normal tracheal position.  Respiratory: No labored breathing, no use of accessory muscles. Clear bilaterally.  Cardiovascular: Normal temperature, normal extremity pulses, no swelling, no varicosities. Regular rate and rhythm.  Lymphatic: No enlargement of neck, axillae, groin.  Skin: No paleness, no jaundice, no cyanosis. No lesion, no ulcer, no rash.  Neurologic / Psychiatric: Oriented to time, oriented to place, oriented to person. No depression, no anxiety, no agitation.  Gastrointestinal: No mass, no tenderness, no rigidity, non obese abdomen.  Eyes: Normal conjunctivae. Normal eyelids.  Ears, Nose, Mouth, and Throat: Left ear no scars, no lesions, no masses. Right ear no scars, no lesions, no masses. Nose no scars, no lesions, no masses. Normal hearing. Normal lips.  Musculoskeletal: Normal gait and station of head and neck.     PAST DATA REVIEWED:  Source Of History:  Patient  Records Review:   Previous Patient Records  Urine Test Review:   Urinalysis   PROCEDURES:          Urinalysis Dipstick Dipstick Cont'd  Color: Yellow Bilirubin: Neg  Appearance: Clear Ketones: Neg  Specific Gravity:  1.025 Blood: Neg  pH: 5.5 Protein: Neg  Glucose: Neg Urobilinogen: 0.2    Nitrites: Neg    Leukocyte Esterase: Neg    ASSESSMENT:      ICD-10 Details  1 NON-GU:   Other intra-abdominal and pelvic swelling, mass and lump - R19.09    PLAN:           Schedule Return Visit/Planned Activity: Keep Scheduled Appointment          Document Letter(s):  Created for Patient: Clinical Summary         Notes:   1. Left perirenal mass: He is scheduled undergo a left robot-assisted laparoscopic excisional biopsy of his left perirenal mass. We reviewed the procedure in detail today including the potential risks, complications, and expected recovery process. He understands the risks may include but are not limited to bleeding, infection, major cardiopulmonary risks associated with general anesthesia, risks of damage to adjacent structures, possibility of having to resect a portion of the kidney, and failure to make a definitive diagnosis, etc. He understands to expect an overnight hospital stay.   Cc: Dr. Zola Button  Dr. Judd Lien    E & M CODE: I spent at least 25 minutes face to face  with the patient, more than 50% of that time was spent on counseling and/or coordinating care.     * Signed by Raynelle Bring, M.D. on 12/30/16 at 7:23 AM (EST)*

## 2017-01-08 ENCOUNTER — Telehealth: Payer: Self-pay | Admitting: Oncology

## 2017-01-08 ENCOUNTER — Observation Stay (HOSPITAL_COMMUNITY)
Admission: RE | Admit: 2017-01-08 | Discharge: 2017-01-09 | Disposition: A | Discharge status: Home / Self Care | Payer: BC Managed Care – PPO | Source: Ambulatory Visit | Attending: Urology | Admitting: Urology

## 2017-01-08 ENCOUNTER — Encounter (HOSPITAL_COMMUNITY): Payer: Self-pay | Admitting: Anesthesiology

## 2017-01-08 ENCOUNTER — Telehealth: Payer: Self-pay | Admitting: *Deleted

## 2017-01-08 ENCOUNTER — Inpatient Hospital Stay (HOSPITAL_COMMUNITY): Payer: BC Managed Care – PPO | Admitting: Anesthesiology

## 2017-01-08 ENCOUNTER — Encounter (HOSPITAL_COMMUNITY)
Admission: RE | Disposition: A | Discharge status: Home / Self Care | Payer: Self-pay | Source: Ambulatory Visit | Attending: Urology

## 2017-01-08 DIAGNOSIS — C8519 Unspecified B-cell lymphoma, extranodal and solid organ sites: Secondary | ICD-10-CM | POA: Diagnosis not present

## 2017-01-08 DIAGNOSIS — Z79899 Other long term (current) drug therapy: Secondary | ICD-10-CM | POA: Diagnosis not present

## 2017-01-08 DIAGNOSIS — Z841 Family history of disorders of kidney and ureter: Secondary | ICD-10-CM | POA: Diagnosis not present

## 2017-01-08 DIAGNOSIS — K219 Gastro-esophageal reflux disease without esophagitis: Secondary | ICD-10-CM | POA: Insufficient documentation

## 2017-01-08 DIAGNOSIS — Z7982 Long term (current) use of aspirin: Secondary | ICD-10-CM | POA: Diagnosis not present

## 2017-01-08 DIAGNOSIS — Z7951 Long term (current) use of inhaled steroids: Secondary | ICD-10-CM | POA: Insufficient documentation

## 2017-01-08 DIAGNOSIS — Z9889 Other specified postprocedural states: Secondary | ICD-10-CM | POA: Insufficient documentation

## 2017-01-08 DIAGNOSIS — R1900 Intra-abdominal and pelvic swelling, mass and lump, unspecified site: Secondary | ICD-10-CM | POA: Diagnosis present

## 2017-01-08 DIAGNOSIS — E7800 Pure hypercholesterolemia, unspecified: Secondary | ICD-10-CM | POA: Insufficient documentation

## 2017-01-08 DIAGNOSIS — N2889 Other specified disorders of kidney and ureter: Secondary | ICD-10-CM | POA: Diagnosis present

## 2017-01-08 HISTORY — PX: ROBOTIC ASSITED PARTIAL NEPHRECTOMY: SHX6087

## 2017-01-08 HISTORY — DX: Personal history of other diseases of the respiratory system: Z87.09

## 2017-01-08 LAB — TYPE AND SCREEN
ABO/RH(D): O POS
ANTIBODY SCREEN: NEGATIVE

## 2017-01-08 SURGERY — NEPHRECTOMY, PARTIAL, ROBOT-ASSISTED
Anesthesia: General | Laterality: Left

## 2017-01-08 MED ORDER — SODIUM CHLORIDE 0.9% FLUSH: 3.0000 mL | Freq: Two times a day (BID) | INTRAVENOUS | Status: DC

## 2017-01-08 MED ORDER — BUPIVACAINE HCL (PF) 0.25 % IJ SOLN
INTRAMUSCULAR | Status: AC
  Filled 2017-01-08: qty 30

## 2017-01-08 MED ORDER — CEFAZOLIN IN D5W 1 GM/50ML IV SOLN
1.0000 g | Freq: Three times a day (TID) | INTRAVENOUS | Status: AC
  Administered 2017-01-08 – 2017-01-09 (×2): 1 g via INTRAVENOUS
  Filled 2017-01-08 (×2): qty 50

## 2017-01-08 MED ORDER — HYDROMORPHONE HCL 1 MG/ML IJ SOLN: 0.2500 mg | INTRAMUSCULAR | Status: DC | PRN

## 2017-01-08 MED ORDER — HYDROMORPHONE HCL 1 MG/ML IJ SOLN
INTRAMUSCULAR | Status: DC | PRN
  Administered 2017-01-08 (×2): 0.5 mg via INTRAVENOUS

## 2017-01-08 MED ORDER — ROCURONIUM BROMIDE 10 MG/ML (PF) SYRINGE
PREFILLED_SYRINGE | INTRAVENOUS | Status: DC | PRN
  Administered 2017-01-08: 50 mg via INTRAVENOUS
  Administered 2017-01-08: 20 mg via INTRAVENOUS
  Administered 2017-01-08 (×2): 10 mg via INTRAVENOUS

## 2017-01-08 MED ORDER — FENTANYL CITRATE (PF) 100 MCG/2ML IJ SOLN
INTRAMUSCULAR | Status: DC | PRN
  Administered 2017-01-08: 100 ug via INTRAVENOUS
  Administered 2017-01-08: 50 ug via INTRAVENOUS
  Administered 2017-01-08: 100 ug via INTRAVENOUS

## 2017-01-08 MED ORDER — DEXTROSE-NACL 5-0.45 % IV SOLN
INTRAVENOUS | Status: DC
  Administered 2017-01-08 – 2017-01-09 (×2): via INTRAVENOUS

## 2017-01-08 MED ORDER — MIDAZOLAM HCL 5 MG/5ML IJ SOLN
INTRAMUSCULAR | Status: DC | PRN
  Administered 2017-01-08: 2 mg via INTRAVENOUS

## 2017-01-08 MED ORDER — ESMOLOL HCL 100 MG/10ML IV SOLN
INTRAVENOUS | Status: AC
  Filled 2017-01-08: qty 10

## 2017-01-08 MED ORDER — STERILE WATER FOR IRRIGATION IR SOLN
Status: DC | PRN
  Administered 2017-01-08: 1000 mL

## 2017-01-08 MED ORDER — ONDANSETRON HCL 4 MG/2ML IJ SOLN
INTRAMUSCULAR | Status: DC | PRN
  Administered 2017-01-08 (×2): 4 mg via INTRAVENOUS

## 2017-01-08 MED ORDER — FENTANYL CITRATE (PF) 250 MCG/5ML IJ SOLN
INTRAMUSCULAR | Status: AC
  Filled 2017-01-08: qty 5

## 2017-01-08 MED ORDER — CEFAZOLIN SODIUM-DEXTROSE 2-4 GM/100ML-% IV SOLN
INTRAVENOUS | Status: AC
  Filled 2017-01-08: qty 100

## 2017-01-08 MED ORDER — DIPHENHYDRAMINE HCL 50 MG/ML IJ SOLN: 12.5000 mg | Freq: Four times a day (QID) | INTRAMUSCULAR | Status: DC | PRN

## 2017-01-08 MED ORDER — SODIUM CHLORIDE 0.9 % IV SOLN: 250.0000 mL | INTRAVENOUS | Status: DC | PRN

## 2017-01-08 MED ORDER — PROPOFOL 10 MG/ML IV BOLUS
INTRAVENOUS | Status: AC
  Filled 2017-01-08: qty 20

## 2017-01-08 MED ORDER — MAGNESIUM CITRATE PO SOLN
1.0000 | Freq: Once | ORAL | Status: DC
  Filled 2017-01-08: qty 296

## 2017-01-08 MED ORDER — BUPIVACAINE LIPOSOME 1.3 % IJ SUSP
INTRAMUSCULAR | Status: AC
  Filled 2017-01-08: qty 20

## 2017-01-08 MED ORDER — LACTATED RINGERS IR SOLN
Status: DC | PRN
  Administered 2017-01-08: 1000 mL

## 2017-01-08 MED ORDER — DOCUSATE SODIUM 100 MG PO CAPS
100.0000 mg | ORAL_CAPSULE | Freq: Two times a day (BID) | ORAL | Status: DC
  Administered 2017-01-08: 100 mg via ORAL
  Filled 2017-01-08: qty 1

## 2017-01-08 MED ORDER — OXYCODONE HCL 5 MG/5ML PO SOLN
5.0000 mg | Freq: Once | ORAL | Status: DC | PRN
  Filled 2017-01-08: qty 5

## 2017-01-08 MED ORDER — HEPARIN SODIUM (PORCINE) 1000 UNIT/ML IJ SOLN
INTRAMUSCULAR | Status: AC
  Filled 2017-01-08: qty 1

## 2017-01-08 MED ORDER — OXYCODONE HCL 5 MG PO TABS: 5.0000 mg | ORAL_TABLET | Freq: Once | ORAL | Status: DC | PRN

## 2017-01-08 MED ORDER — LACTATED RINGERS IV SOLN
INTRAVENOUS | Status: DC
  Administered 2017-01-08 (×3): via INTRAVENOUS

## 2017-01-08 MED ORDER — METOCLOPRAMIDE HCL 5 MG/ML IJ SOLN
INTRAMUSCULAR | Status: DC | PRN
  Administered 2017-01-08: 10 mg via INTRAVENOUS

## 2017-01-08 MED ORDER — LORATADINE 10 MG PO TABS: 10.0000 mg | ORAL_TABLET | Freq: Every day | ORAL | Status: DC

## 2017-01-08 MED ORDER — SUGAMMADEX SODIUM 500 MG/5ML IV SOLN
INTRAVENOUS | Status: AC
  Filled 2017-01-08: qty 5

## 2017-01-08 MED ORDER — OXYCODONE-ACETAMINOPHEN 5-325 MG PO TABS: 1.0000 | ORAL_TABLET | ORAL | Status: DC | PRN

## 2017-01-08 MED ORDER — BUPIVACAINE LIPOSOME 1.3 % IJ SUSP
INTRAMUSCULAR | Status: DC | PRN
  Administered 2017-01-08: 20 mL

## 2017-01-08 MED ORDER — ROCURONIUM BROMIDE 50 MG/5ML IV SOSY
PREFILLED_SYRINGE | INTRAVENOUS | Status: AC
  Filled 2017-01-08: qty 5

## 2017-01-08 MED ORDER — SODIUM CHLORIDE 0.9% FLUSH: 3.0000 mL | INTRAVENOUS | Status: DC | PRN

## 2017-01-08 MED ORDER — OXYCODONE-ACETAMINOPHEN 5-325 MG PO TABS: 1.0000 | ORAL_TABLET | ORAL | 0 refills | Status: DC | PRN

## 2017-01-08 MED ORDER — PROPOFOL 10 MG/ML IV BOLUS
INTRAVENOUS | Status: DC | PRN
  Administered 2017-01-08: 200 mg via INTRAVENOUS

## 2017-01-08 MED ORDER — FLUTICASONE PROPIONATE 50 MCG/ACT NA SUSP
2.0000 | Freq: Every day | NASAL | Status: DC
  Filled 2017-01-08: qty 16

## 2017-01-08 MED ORDER — SCOPOLAMINE 1 MG/3DAYS TD PT72: 1.0000 | MEDICATED_PATCH | Freq: Once | TRANSDERMAL | Status: DC

## 2017-01-08 MED ORDER — GLYCOPYRROLATE 0.2 MG/ML IV SOSY
PREFILLED_SYRINGE | INTRAVENOUS | Status: DC | PRN
  Administered 2017-01-08: .3 mg via INTRAVENOUS

## 2017-01-08 MED ORDER — KETOROLAC TROMETHAMINE 15 MG/ML IJ SOLN
15.0000 mg | Freq: Four times a day (QID) | INTRAMUSCULAR | Status: DC
  Administered 2017-01-08 – 2017-01-09 (×3): 15 mg via INTRAVENOUS
  Filled 2017-01-08 (×3): qty 1

## 2017-01-08 MED ORDER — FLEET ENEMA 7-19 GM/118ML RE ENEM: 1.0000 | ENEMA | Freq: Once | RECTAL | Status: DC

## 2017-01-08 MED ORDER — ONDANSETRON HCL 4 MG/2ML IJ SOLN: 4.0000 mg | INTRAMUSCULAR | Status: DC | PRN

## 2017-01-08 MED ORDER — LIDOCAINE 2% (20 MG/ML) 5 ML SYRINGE
INTRAMUSCULAR | Status: DC | PRN
  Administered 2017-01-08: 50 mg via INTRAVENOUS

## 2017-01-08 MED ORDER — DIPHENHYDRAMINE HCL 12.5 MG/5ML PO ELIX: 12.5000 mg | ORAL_SOLUTION | Freq: Four times a day (QID) | ORAL | Status: DC | PRN

## 2017-01-08 MED ORDER — PANTOPRAZOLE SODIUM 40 MG PO TBEC
80.0000 mg | DELAYED_RELEASE_TABLET | Freq: Every day | ORAL | Status: DC
  Administered 2017-01-08: 80 mg via ORAL
  Filled 2017-01-08: qty 2

## 2017-01-08 MED ORDER — ONDANSETRON HCL 4 MG/2ML IJ SOLN: 4.0000 mg | Freq: Four times a day (QID) | INTRAMUSCULAR | Status: DC | PRN

## 2017-01-08 MED ORDER — HYDROMORPHONE HCL 2 MG/ML IJ SOLN
INTRAMUSCULAR | Status: AC
  Filled 2017-01-08: qty 1

## 2017-01-08 MED ORDER — SODIUM CHLORIDE 0.9 % IJ SOLN
INTRAMUSCULAR | Status: DC | PRN
  Administered 2017-01-08: 20 mL

## 2017-01-08 MED ORDER — ESMOLOL HCL 100 MG/10ML IV SOLN
INTRAVENOUS | Status: DC | PRN
  Administered 2017-01-08: 10 mg via INTRAVENOUS

## 2017-01-08 MED ORDER — MIDAZOLAM HCL 2 MG/2ML IJ SOLN
INTRAMUSCULAR | Status: AC
  Filled 2017-01-08: qty 2

## 2017-01-08 MED ORDER — CEFAZOLIN SODIUM-DEXTROSE 2-4 GM/100ML-% IV SOLN
2.0000 g | INTRAVENOUS | Status: AC
  Administered 2017-01-08: 2 g via INTRAVENOUS
  Filled 2017-01-08: qty 100

## 2017-01-08 MED ORDER — SCOPOLAMINE 1 MG/3DAYS TD PT72
1.0000 | MEDICATED_PATCH | TRANSDERMAL | Status: DC
  Administered 2017-01-08: 1.5 mg via TRANSDERMAL

## 2017-01-08 MED ORDER — DEXAMETHASONE SODIUM PHOSPHATE 10 MG/ML IJ SOLN
INTRAMUSCULAR | Status: DC | PRN
  Administered 2017-01-08: 10 mg via INTRAVENOUS

## 2017-01-08 MED ORDER — SCOPOLAMINE 1 MG/3DAYS TD PT72
MEDICATED_PATCH | TRANSDERMAL | Status: AC
  Filled 2017-01-08: qty 1

## 2017-01-08 SURGICAL SUPPLY — 50 items
APPLICATOR SURGIFLO ENDO (HEMOSTASIS) IMPLANT
CHLORAPREP W/TINT 26ML (MISCELLANEOUS) ×2 IMPLANT
CLIP LIGATING HEM O LOK PURPLE (MISCELLANEOUS) ×2 IMPLANT
CLIP LIGATING HEMO O LOK GREEN (MISCELLANEOUS) ×2 IMPLANT
COVER SURGICAL LIGHT HANDLE (MISCELLANEOUS) ×2 IMPLANT
COVER TIP SHEARS 8 DVNC (MISCELLANEOUS) ×1 IMPLANT
COVER TIP SHEARS 8MM DA VINCI (MISCELLANEOUS) ×1
DECANTER SPIKE VIAL GLASS SM (MISCELLANEOUS) ×2 IMPLANT
DERMABOND ADVANCED (GAUZE/BANDAGES/DRESSINGS) ×1
DERMABOND ADVANCED .7 DNX12 (GAUZE/BANDAGES/DRESSINGS) ×1 IMPLANT
DRAIN CHANNEL 15F RND FF 3/16 (WOUND CARE) IMPLANT
DRAPE ARM DVNC X/XI (DISPOSABLE) ×4 IMPLANT
DRAPE COLUMN DVNC XI (DISPOSABLE) ×1 IMPLANT
DRAPE DA VINCI XI ARM (DISPOSABLE) ×4
DRAPE DA VINCI XI COLUMN (DISPOSABLE) ×1
DRAPE INCISE IOBAN 66X45 STRL (DRAPES) ×2 IMPLANT
DRAPE SHEET LG 3/4 BI-LAMINATE (DRAPES) ×2 IMPLANT
ELECT PENCIL ROCKER SW 15FT (MISCELLANEOUS) ×2 IMPLANT
ELECT REM PT RETURN 9FT ADLT (ELECTROSURGICAL) ×2
ELECTRODE REM PT RTRN 9FT ADLT (ELECTROSURGICAL) ×1 IMPLANT
EVACUATOR SILICONE 100CC (DRAIN) IMPLANT
GLOVE BIO SURGEON STRL SZ 6.5 (GLOVE) ×2 IMPLANT
GLOVE BIOGEL M STRL SZ7.5 (GLOVE) ×6 IMPLANT
GOWN STRL REUS W/TWL LRG LVL3 (GOWN DISPOSABLE) ×10 IMPLANT
HEMOSTAT SURGICEL 4X8 (HEMOSTASIS) IMPLANT
IRRIG SUCT STRYKERFLOW 2 WTIP (MISCELLANEOUS) ×2
IRRIGATION SUCT STRKRFLW 2 WTP (MISCELLANEOUS) ×1 IMPLANT
KIT BASIN OR (CUSTOM PROCEDURE TRAY) ×2 IMPLANT
POSITIONER SURGICAL ARM (MISCELLANEOUS) ×4 IMPLANT
POUCH SPECIMEN RETRIEVAL 10MM (ENDOMECHANICALS) ×2 IMPLANT
SEAL CANN UNIV 5-8 DVNC XI (MISCELLANEOUS) ×4 IMPLANT
SEAL XI 5MM-8MM UNIVERSAL (MISCELLANEOUS) ×4
SOLUTION ELECTROLUBE (MISCELLANEOUS) ×2 IMPLANT
SURGIFLO W/THROMBIN 8M KIT (HEMOSTASIS) IMPLANT
SUT ETHILON 3 0 PS 1 (SUTURE) IMPLANT
SUT MNCRL AB 4-0 PS2 18 (SUTURE) ×4 IMPLANT
SUT V-LOC BARB 180 2/0GR6 GS22 (SUTURE)
SUT VIC AB 0 CT1 27 (SUTURE) ×1
SUT VIC AB 0 CT1 27XBRD ANTBC (SUTURE) ×1 IMPLANT
SUT VICRYL 0 UR6 27IN ABS (SUTURE) ×4 IMPLANT
SUT VLOC BARB 180 ABS3/0GR12 (SUTURE)
SUTURE V-LC BRB 180 2/0GR6GS22 (SUTURE) IMPLANT
SUTURE VLOC BRB 180 ABS3/0GR12 (SUTURE) IMPLANT
TAPE STRIPS DRAPE STRL (GAUZE/BANDAGES/DRESSINGS) IMPLANT
TOWEL OR 17X26 10 PK STRL BLUE (TOWEL DISPOSABLE) ×4 IMPLANT
TRAY FOLEY W/METER SILVER 16FR (SET/KITS/TRAYS/PACK) ×2 IMPLANT
TRAY LAPAROSCOPIC (CUSTOM PROCEDURE TRAY) ×2 IMPLANT
TROCAR BLADELESS OPT 5 100 (ENDOMECHANICALS) ×2 IMPLANT
TROCAR XCEL 12X100 BLDLESS (ENDOMECHANICALS) ×2 IMPLANT
WATER STERILE IRR 1500ML POUR (IV SOLUTION) IMPLANT

## 2017-01-08 NOTE — Telephone Encounter (Signed)
Patient's wife called to let you know that he just had surgery today 1/22 and needs to reschedule his 1/25 appointment because the results of the biopsy will not be in for at least a week.

## 2017-01-08 NOTE — Anesthesia Preprocedure Evaluation (Signed)
Anesthesia Evaluation  Patient identified by MRN, date of birth, ID band Patient awake    Reviewed: Allergy & Precautions, H&P , NPO status , Patient's Chart, lab work & pertinent test results  History of Anesthesia Complications (+) PONV and history of anesthetic complications  Airway Mallampati: II   Neck ROM: full    Dental   Pulmonary    breath sounds clear to auscultation       Cardiovascular negative cardio ROS   Rhythm:regular Rate:Normal     Neuro/Psych    GI/Hepatic GERD  ,  Endo/Other    Renal/GU Peri-nephric mass     Musculoskeletal   Abdominal   Peds  Hematology   Anesthesia Other Findings   Reproductive/Obstetrics                             Anesthesia Physical Anesthesia Plan  ASA: II  Anesthesia Plan: General   Post-op Pain Management:    Induction: Intravenous  Airway Management Planned: Oral ETT  Additional Equipment:   Intra-op Plan:   Post-operative Plan: Extubation in OR  Informed Consent: I have reviewed the patients History and Physical, chart, labs and discussed the procedure including the risks, benefits and alternatives for the proposed anesthesia with the patient or authorized representative who has indicated his/her understanding and acceptance.     Plan Discussed with: CRNA, Anesthesiologist and Surgeon  Anesthesia Plan Comments:         Anesthesia Quick Evaluation

## 2017-01-08 NOTE — Discharge Instructions (Signed)

## 2017-01-08 NOTE — Anesthesia Procedure Notes (Signed)
Procedure Name: Intubation Date/Time: 01/08/2017 11:30 AM Performed by: Truong Delcastillo, Virgel Gess Pre-anesthesia Checklist: Patient identified, Emergency Drugs available, Suction available, Patient being monitored and Timeout performed Patient Re-evaluated:Patient Re-evaluated prior to inductionOxygen Delivery Method: Circle system utilized Preoxygenation: Pre-oxygenation with 100% oxygen Intubation Type: IV induction Ventilation: Mask ventilation without difficulty Laryngoscope Size: Mac and 4 Grade View: Grade II Tube type: Oral Tube size: 7.5 mm Number of attempts: 1 Airway Equipment and Method: Stylet Placement Confirmation: ETT inserted through vocal cords under direct vision,  positive ETCO2,  CO2 detector and breath sounds checked- equal and bilateral Secured at: 22 cm Tube secured with: Tape Dental Injury: Teeth and Oropharynx as per pre-operative assessment

## 2017-01-08 NOTE — Op Note (Signed)
Preoperative diagnosis: Left perinephric mass  Postoperative diagnosis: Left perinephric mass  Procedure:  1. Robotic-assisted laparoscopic excision of left perinephric mass  Surgeon: Pryor Curia. M.D.  Assistant(s): Nicholas Crews, MD  An assistant was required for this surgical procedure.  The duties of the assistant included but were not limited to suctioning, passing suture, camera manipulation, retraction. This procedure would not be able to be performed without an Environmental consultant.   Anesthesia: General  Complications: None  EBL: 25 mL  IVF:  1500 mL crystalloid  Specimens: 1. Left perinephric mass  Disposition of specimens: Pathology  Intraoperative findings:       1. 2cm left perinephric mass with abnormal grayish white appearing tissue in expected location lateral to kidney below the level of renal vessels successfully excised intact without entry into the renal capsule  Drains: none  Indication:  Nicholas Pope is a 57 y.o. year old patient with a left perinephric mass.  A biopsy of this was likely consistent with lymphoma but there was not enough tissue for definitive diagnosis. We were asked to assist with obtaining excisional biopsy.  We have discussed the potential benefits and risks of the procedure, side effects of the proposed treatment, the likelihood of the patient achieving the goals of the procedure, and any potential problems that might occur during the procedure or recuperation. Informed consent has been obtained.   Description of procedure:  The patient was taken to the operating room and a general anesthetic was administered. The patient was given preoperative antibiotics, placed in the left modified flank position with care to pad all potential pressure points, and prepped and draped in the usual sterile fashion. Next a preoperative timeout was performed.  A site was selected on the left side of the umbilicus for placement of the camera port. This  was placed using a standard open Hassan technique which allowed entry into the peritoneal cavity under direct vision and without difficulty. A 12 mm assistant port was placed above the umbilicus and a pneumoperitoneum established. The camera was then used to inspect the abdomen and there was no evidence of any intra-abdominal injuries or other abnormalities. The remaining abdominal ports were then placed. 8 mm robotic ports were placed in the left upper quadrant, lateral to the umbilicus on the left, left lower quadrant, and far left lateral abdominal wall.  All ports were placed under direct vision without difficulty. The surgical cart was then docked.   Utilizing the cautery scissors, the white line of Toldt was incised allowing the colon to be mobilized medially and the plane between the mesocolon and the anterior layer of Gerota's fascia to be developed and the kidney to be exposed.  We then took down attachments lateral to the kidney in order to better define the boundaries of the left kidney.  Attention turned to the kidney and the perinephric fat surrounding the midpole of the kidney was removed in order to expose the perinephric mass. This was encountered in the expected location lateral to kidney below the level of renal vessels. The appearance was abnormal with grayish white tissue and sticky fat consistent with desmoplastic reaction or prior biopsy sequelae.  We used a combination of blunt dissection and electrocautery to peel this off the renal capsule, taking care to not enter the renal capsule. We were able to peel the mass off the kidney and excised a boundary of perinephric fat around the mass. The mass was placed within an endopouch retrieval bag.  The resection site  was examined. Hemostasis appeared adequate.   The surgical cart was undocked.  All other laparoscopic/robotic ports were removed under direct vision and the pneumoperitoneum let down with inspection of the operative field  performed and hemostasis again confirmed.  The perinephric mass specimen was removed intact within an endopouch retrieval bag via the 33mm assistant port.  The 12 mm assistant port site was then closed at the fascial level with two 0-vicryl suture running from each end. A subcutaneous layer was closed with interrupted 2-0 vicryl sutures. All incision sites were then injected with local anesthetic and reapproximated at the skin level with 4-0 monocryl subcuticular closures.  Dermabond was applied to the skin.  The patient tolerated the procedure well and without complications.  The patient was able to be extubated and transferred to the recovery unit in satisfactory condition.  Dr. Alinda Money was present and scrubbed for the entire procedure.

## 2017-01-08 NOTE — Discharge Summary (Signed)
Date of admission: 01/08/2017  Date of discharge: 01/09/2017  Admission diagnosis: Left perinephric mass   Discharge diagnosis: Left perinephric mass  History and Physical: For full details, please see admission history and physical. Briefly, Nicholas Pope is a 56 y.o. gentleman with Left perinephric mass.  After discussing management/treatment options, he elected to proceed with surgical excisional biopsy.  Hospital Course: Nicholas Pope was taken to the operating room on 01/08/2017 and underwent a robotic assisted laparoscopic excision of left perinephric mass. He tolerated this procedure well and without complications. Postoperatively, he was able to be transferred to a regular hospital room following recovery from anesthesia.  He was able to begin ambulating the night of surgery. He remained hemodynamically stable overnight.  He was transitioned to oral pain medication, tolerated a regular diet, and had met all discharge criteria and was able to be discharged home later on POD#1.  Laboratory values:   Recent Labs  01/09/17 0504  HGB 13.8  HCT 39.3    Disposition: Home  Discharge instruction: He was instructed to be ambulatory but to refrain from heavy lifting, strenuous activity, or driving.   Discharge medications:   Allergies as of 01/09/2017   No Known Allergies     Medication List    STOP taking these medications   aspirin 325 MG tablet     TAKE these medications   fluticasone 50 MCG/ACT nasal spray Commonly known as:  FLONASE Place 2 sprays into both nostrils daily.   folic acid 885 MCG tablet Commonly known as:  FOLVITE Take 400 mcg by mouth at bedtime.   loratadine 10 MG tablet Commonly known as:  CLARITIN Take 10 mg by mouth daily.   multivitamin with minerals Tabs tablet Take 1 tablet by mouth at bedtime. Men's One-A-Day   omeprazole 40 MG capsule Commonly known as:  PRILOSEC Take 40 mg by mouth at bedtime.   oxyCODONE-acetaminophen 5-325 MG  tablet Commonly known as:  ROXICET Take 1-2 tablets by mouth every 4 (four) hours as needed.       Followup: He will followup for post op check and will be called with his surgical pathology results.

## 2017-01-08 NOTE — Telephone Encounter (Signed)
Wife called and stated,"Nicholas Pope had his surgery today. He is suppose to see Dr. Alen Blew on Thursday, 1/25. Dr. Alinda Money told us that we need to cancel that appointment because biopsy results won't be back in time. Please ask Dr. Alen Blew if he really wants this appointment cancelled." Return number is [phone]. Informed her that Dr. Alen Blew was out of the office today and I will ask him tomorrow. Wife verbalized understanding.

## 2017-01-08 NOTE — Transfer of Care (Signed)
Immediate Anesthesia Transfer of Care Note  Patient: Nicholas Pope  Procedure(s) Performed: Procedure(s): XI ROBOTIC ASSITED RESECTION OF PERINEPHRIC MASS (Left)  Patient Location: PACU  Anesthesia Type:General  Level of Consciousness:  sedated, patient cooperative and responds to stimulation  Airway & Oxygen Therapy:Patient Spontanous Breathing and Patient connected to face mask oxgen  Post-op Assessment:  Report given to PACU RN and Post -op Vital signs reviewed and stable  Post vital signs:  Reviewed and stable  Last Vitals:  Vitals:   01/08/17 0835  BP: 137/90  Pulse: 86  Resp: 18  Temp: [id number] C    Complications: No apparent anesthesia complications

## 2017-01-08 NOTE — Progress Notes (Signed)
Patient ID: SUNJAY ANIELLO, male   DOB: 1960-12-31, 56 y.o.   MRN: [id number]  Post-op note  Subjective: The patient is doing well.  No complaints.  Objective: Vital signs in last 24 hours: Temp:  [97.3 F (36.3 C)-98 F (36.7 C)] 98 F (36.7 C) (01/22 1504) Pulse Rate:  [74-86] 74 (01/22 1430) Resp:  [11-18] 12 (01/22 1504) BP: (123-137)/(78-90) 130/87 (01/22 1504) SpO2:  [93 %-99 %] 98 % (01/22 1504) Weight:  [107.5 kg (237 lb)] 107.5 kg (237 lb) (01/22 0859)  Intake/Output from previous day: No intake/output data recorded. Intake/Output this shift: Total I/O In: 1700 [I.V.:1700] Out: 225 [Urine:200; Blood:25]  Physical Exam:  General: Alert and oriented. Abdomen: Soft, Nondistended. Incisions: Clean and dry.  Lab Results: No results for input(s): HGB, HCT in the last 72 hours.  Assessment/Plan: POD#0   1) Continue to monitor, ambulate, IS   Pryor Curia. MD   LOS: 1 day   Vincent Ehrler,LES 01/08/2017, 4:37 PM

## 2017-01-08 NOTE — Anesthesia Postprocedure Evaluation (Addendum)
Anesthesia Post Note  Patient: Nicholas Pope  Procedure(s) Performed: Procedure(s) (LRB): XI ROBOTIC ASSITED RESECTION OF PERINEPHRIC MASS (Left)  Patient location during evaluation: PACU Anesthesia Type: General Level of consciousness: sedated Pain management: pain level controlled Vital Signs Assessment: post-procedure vital signs reviewed and stable Respiratory status: spontaneous breathing and respiratory function stable Cardiovascular status: stable Anesthetic complications: no       Last Vitals:  Vitals:   01/08/17 1430 01/08/17 1445  BP: 128/87 124/85  Pulse: 74   Resp: 12   Temp:  36.6 C    Last Pain:  Vitals:   01/08/17 1445  TempSrc:   PainSc: 0-No pain                 Armanda Forand DANIEL

## 2017-01-08 NOTE — Interval H&P Note (Signed)
History and Physical Interval Note:  01/08/2017 10:14 AM  Nicholas Pope  has presented today for surgery, with the diagnosis of LEFT PERINEPHRIC MASS  The various methods of treatment have been discussed with the patient and family. After consideration of risks, benefits and other options for treatment, the patient has consented to  Procedure(s): XI ROBOTIC Latty (Left) as a surgical intervention .  The patient's history has been reviewed, patient examined, no change in status, stable for surgery.  I have reviewed the patient's chart and labs.  Questions were answered to the patient's satisfaction.     Sian Joles,LES

## 2017-01-09 ENCOUNTER — Telehealth: Payer: Self-pay | Admitting: Oncology

## 2017-01-09 ENCOUNTER — Encounter: Payer: Self-pay | Admitting: Urology

## 2017-01-09 ENCOUNTER — Encounter (HOSPITAL_COMMUNITY): Payer: Self-pay | Admitting: Urology

## 2017-01-09 DIAGNOSIS — C8519 Unspecified B-cell lymphoma, extranodal and solid organ sites: Secondary | ICD-10-CM | POA: Diagnosis not present

## 2017-01-09 LAB — HEMOGLOBIN AND HEMATOCRIT, BLOOD
HEMATOCRIT: 39.3 % (ref 39.0–52.0)
HEMOGLOBIN: 13.8 g/dL (ref 13.0–17.0)

## 2017-01-09 NOTE — Progress Notes (Deleted)
To Whom It May Concern,   Mr. Pena underwent a major surgical procedure on 01/08/17 and will need to recover post-operatively. Please excuse him from work for 2 weeks (although he may return sooner if desired).   We will need to avoid lifting more than 10lbs at work for the next 4 weeks.  Please don't hesitate to contact me with any questions.  Sincerely,    Virginia Crews, MD [facility] [phone]

## 2017-01-09 NOTE — Telephone Encounter (Signed)
R/s pt appt to 2/1 at 3 pm per LOS. Per sch msg pt will be inform of change

## 2017-01-09 NOTE — Telephone Encounter (Signed)
Please move his appointment one week to 2/1 same time.

## 2017-01-11 ENCOUNTER — Ambulatory Visit: Payer: BC Managed Care – PPO | Admitting: Oncology

## 2017-01-15 ENCOUNTER — Telehealth: Payer: Self-pay | Admitting: Oncology

## 2017-01-15 ENCOUNTER — Ambulatory Visit (HOSPITAL_BASED_OUTPATIENT_CLINIC_OR_DEPARTMENT_OTHER): Payer: BC Managed Care – PPO | Admitting: Oncology

## 2017-01-15 VITALS — BP 130/71 | HR 96 | Temp 98.0°F | Resp 20 | Ht 73.0 in | Wt 241.4 lb

## 2017-01-15 DIAGNOSIS — C8386 Other non-follicular lymphoma, intrapelvic lymph nodes: Secondary | ICD-10-CM

## 2017-01-15 NOTE — Progress Notes (Signed)
Hematology and Oncology Follow Up Visit  Nicholas Pope [id number] 1961-08-04 56 y.o. 01/15/2017 10:09 AM Nicholas Pope, MDBurdine, Nicholas Evener, MD   Principle Diagnosis: 56 year old gentleman with 1.7 x 2.3 cm mass in the pararenal space detected in October 2017. 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   Prior Therapy:  Status post needle biopsy done on 11/07/2016 which showed atypical lymphoid proliferation suspicious for lymphoma. The immunohistochemical staining suggest marginal zone lymphoma although there is no monoclonality by flow cytometry. More tissue is recommended to evaluate the process.  He is S/P robotic-assisted laparoscopic excision of a left perinephric mass done by Dr. Alinda Pope on 01/08/2017. 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  Current therapy: Observation and surveillance.  Interim History: Nicholas Pope presents today for a follow-up visit. Since the last visit, he underwent a laparoscopic excisional biopsy of his left perinephric mass and tolerated the procedure well. He denied any complications or abdominal pain. He denied any hematuria or dysuria.  He denied any fevers or chills or sweats. He does not report any cough or abdominal pain. He denied any flank pain. He does not report any changes in his performance status or activity level. He is completely asymptomatic at this time.  He does not report any headaches, blurry vision, syncope or seizures. He does not report any fevers, chills, sweats or weight loss. He does not report any chest pain, palpitation, orthopnea or leg edema. He does not report any cough, wheezing or hemoptysis. He is not reporting nausea, vomiting, abdominal pain, constipation, diarrhea or change in his bowel habits. He does not report any lymphadenopathy or petechiae. He does not report any skeletal complaints. Remaining review of systems unremarkable.   Medications: I have reviewed the patient's current  medications.  Current Outpatient Prescriptions  Medication Sig Dispense Refill  . fluticasone (FLONASE) 50 MCG/ACT nasal spray Place 2 sprays into both nostrils daily.    . folic acid (FOLVITE) A999333 MCG tablet Take 400 mcg by mouth at bedtime.     Marland Kitchen loratadine (CLARITIN) 10 MG tablet Take 10 mg by mouth daily.    . Multiple Vitamin (MULTIVITAMIN WITH MINERALS) TABS tablet Take 1 tablet by mouth at bedtime. Men's One-A-Day    . omeprazole (PRILOSEC) 40 MG capsule Take 40 mg by mouth at bedtime.     Marland Kitchen oxyCODONE-acetaminophen (ROXICET) 5-325 MG tablet Take 1-2 tablets by mouth every 4 (four) hours as needed. (Patient not taking: Reported on 01/15/2017) 30 tablet 0   No current facility-administered medications for this visit.      Allergies: No Known Allergies  Past Medical History, Surgical history, Social history, and Family History were reviewed and updated.   Physical Exam: Blood pressure 130/71, pulse 96, temperature 98 F (36.7 C), temperature source Oral, resp. rate 20, height 6\' 1"  (1.854 m), weight 241 lb 6.4 oz (109.5 kg), SpO2 99 %. ECOG: 0 General appearance: alert and cooperative appeared without distress. Head: Normocephalic, without obvious abnormality Neck: no adenopathy Lymph nodes: Cervical, supraclavicular, and axillary nodes normal. Heart:regular rate and rhythm, S1, S2 normal, no murmur, click, rub or gallop Lung:chest clear, no wheezing, rales, normal symmetric air entry Abdomin: soft, non-tender, without masses or organomegaly. Incisions from his laparoscopic procedure are well healed with some mild erythema noted. EXT:no erythema, induration, or nodules   Lab Results: Lab Results  Component Value Date   WBC 5.3 01/01/2017   HGB 13.8 01/09/2017   HCT 39.3 01/09/2017   MCV 83.4 01/01/2017  PLT 216 01/01/2017     Chemistry      Component Value Date/Time   NA 138 01/01/2017 0922   K 4.4 01/01/2017 0922   CL 106 01/01/2017 0922   CO2 24 01/01/2017 0922    BUN 14 01/01/2017 0922   CREATININE 0.75 01/01/2017 0922      Component Value Date/Time   CALCIUM 8.8 (L) 01/01/2017 [id number]    IMPRESSION: 1. Hypermetabolic soft tissue in the left perirenal space is consistent with lymphoma. 2. Second small nodule in the retroperitoneum inferior to the lower pole of the LEFT kidney is consistent with lymphoma. 3. Small round lesion adjacent to the RIGHT kidney is not hypermetabolic. Recommend attention on follow-up. 4. Small LEFT lower lobe pulmonary nodule is likely benign. Attention on follow-up. 5. No hypermetabolic or enlarged abdominal, iliac or pelvic lymph nodes. No thoracic adenopathy or neck adenopathy.  Diagnosis Soft tissue mass, simple excision, left perinephric - NON-HODGKIN'S B-CELL LYMPHOMA. - SEE ONCOLOGY TABLE. Microscopic Comment LYMPHOMA   Impression and Plan:   56 year old woman with the following issues:  1. 1.7 x 2.3 enhancing perirenal soft tissue lesion diagnosed in October 2017. He is status post biopsy done on 11/07/2016 which showed atypical lymphoid proliferation suspicious for lymphoma. The immunohistochemical staining suggest marginal zone lymphoma although there is no monoclonality by flow cytometry.  A repeat biopsy completed on 01/08/2007 none laparoscopically confirm the presence of marginal zone lymphoma.  His PET/CT scan on 12/05/2016 showed no wide spread disease.  The natural course of this disease was discussed with the patient and his wife. This represents a low-grade lymphoma with very low volume disease. After the excisional biopsy, his volume of disease is even lasts. There is no indication to start treatment at this time and indication for treatment were reviewed today. Rapidly enlarging abdominal adenopathy, constitutional symptoms, [place] compromise, among others would be reasons to start treatment. The likelihood of this happening is very low given the low-grade nature of this lymphoma.  I have  recommended observation and surveillance with repeat imaging studies in 6 months from his last PET scan. If he has signs and symptoms progression of disease, systemic chemotherapy will be utilized. Obtaining complete response and long-term remissions is very likely with systemic chemotherapy if needed to. The odds are he will not require any treatment any time in the immediate future.  2. Follow-up: Will be in 3 months.   [facility], MD 1/29/201810:09 AM

## 2017-01-15 NOTE — Telephone Encounter (Signed)
Appointments scheduled per 01/15/17 los. Patient was given a copy of the appointment schedule and AVS report, per 01/15/17 los. °

## 2017-01-18 ENCOUNTER — Encounter: Payer: Self-pay | Admitting: *Deleted

## 2017-01-18 ENCOUNTER — Ambulatory Visit: Payer: BC Managed Care – PPO | Admitting: Oncology

## 2017-04-13 ENCOUNTER — Other Ambulatory Visit (HOSPITAL_BASED_OUTPATIENT_CLINIC_OR_DEPARTMENT_OTHER): Payer: BC Managed Care – PPO

## 2017-04-13 ENCOUNTER — Ambulatory Visit (HOSPITAL_BASED_OUTPATIENT_CLINIC_OR_DEPARTMENT_OTHER): Payer: BC Managed Care – PPO | Admitting: Oncology

## 2017-04-13 ENCOUNTER — Telehealth: Payer: Self-pay | Admitting: Oncology

## 2017-04-13 VITALS — BP 133/87 | HR 69 | Temp 97.9°F | Resp 18 | Ht 73.0 in | Wt 250.3 lb

## 2017-04-13 DIAGNOSIS — Z8572 Personal history of non-Hodgkin lymphomas: Secondary | ICD-10-CM

## 2017-04-13 DIAGNOSIS — C8510 Unspecified B-cell lymphoma, unspecified site: Secondary | ICD-10-CM | POA: Diagnosis not present

## 2017-04-13 DIAGNOSIS — C8290 Follicular lymphoma, unspecified, unspecified site: Secondary | ICD-10-CM

## 2017-04-13 DIAGNOSIS — C8386 Other non-follicular lymphoma, intrapelvic lymph nodes: Secondary | ICD-10-CM

## 2017-04-13 LAB — COMPREHENSIVE METABOLIC PANEL
ALT: 136 U/L — AB (ref 0–55)
ANION GAP: 10 meq/L (ref 3–11)
AST: 82 U/L — ABNORMAL HIGH (ref 5–34)
Albumin: 4.1 g/dL (ref 3.5–5.0)
Alkaline Phosphatase: 105 U/L (ref 40–150)
BUN: 18.6 mg/dL (ref 7.0–26.0)
CALCIUM: 9.1 mg/dL (ref 8.4–10.4)
CO2: 23 meq/L (ref 22–29)
Chloride: 107 mEq/L (ref 98–109)
Creatinine: 0.9 mg/dL (ref 0.7–1.3)
Glucose: 132 mg/dl (ref 70–140)
POTASSIUM: 4.5 meq/L (ref 3.5–5.1)
Sodium: 140 mEq/L (ref 136–145)
Total Bilirubin: 0.49 mg/dL (ref 0.20–1.20)
Total Protein: 7.4 g/dL (ref 6.4–8.3)

## 2017-04-13 LAB — CBC WITH DIFFERENTIAL/PLATELET
BASO%: 0.7 % (ref 0.0–2.0)
BASOS ABS: 0 10*3/uL (ref 0.0–0.1)
EOS ABS: 0.2 10*3/uL (ref 0.0–0.5)
EOS%: 3.1 % (ref 0.0–7.0)
HEMATOCRIT: 43.3 % (ref 38.4–49.9)
HGB: 15 g/dL (ref 13.0–17.1)
LYMPH%: 34.7 % (ref 14.0–49.0)
MCH: 29.1 pg (ref 27.2–33.4)
MCHC: 34.6 g/dL (ref 32.0–36.0)
MCV: 83.9 fL (ref 79.3–98.0)
MONO#: 0.6 10*3/uL (ref 0.1–0.9)
MONO%: 11 % (ref 0.0–14.0)
NEUT#: 2.8 10*3/uL (ref 1.5–6.5)
NEUT%: 50.5 % (ref 39.0–75.0)
PLATELETS: 208 10*3/uL (ref 140–400)
RBC: 5.16 10*6/uL (ref 4.20–5.82)
RDW: 13 % (ref 11.0–14.6)
WBC: 5.5 10*3/uL (ref 4.0–10.3)
lymph#: 1.9 10*3/uL (ref 0.9–3.3)

## 2017-04-13 NOTE — Telephone Encounter (Signed)
Gave patient AVS and calender per 4/27 los. [facility] to contact patient with Pet scan.

## 2017-04-13 NOTE — Progress Notes (Addendum)
Hematology and Oncology Follow Up Visit  Nicholas Pope [id number] 09/19/1961 56 y.o. 04/13/2017 8:31 AM Nicholas Pope, MDBurdine, Nicholas Evener, MD   Principle Diagnosis: 56 year old gentleman with 1.7 x 2.3 cm mass in the pararenal space detected in October 2017. 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   Prior Therapy:  Status post needle biopsy done on 11/07/2016 which showed atypical lymphoid proliferation suspicious for lymphoma. The immunohistochemical staining suggest marginal zone lymphoma although there is no monoclonality by flow cytometry. More tissue is recommended to evaluate the process.  He is S/P robotic-assisted laparoscopic excision of a left perinephric mass done by Nicholas Pope on 01/08/2017. 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  Current therapy: Observation and surveillance.   Interim History: Nicholas Pope presents today for a follow-up visit. Since the last visit, he reports no major changes He denied any complications or abdominal pain. He denied any hematuria or dysuria.  He denied any fevers or chills or sweats. He does not report any cough or abdominal pain. He denied any flank pain. His appetite remains excellent and continues to perform activities of daily living. He gained 10 lbs since the last visit.  He does not report any headaches, blurry vision, syncope or seizures. He does not report any fevers, chills, sweats or weight loss. He does not report any chest pain, palpitation, orthopnea or leg edema. He does not report any cough, wheezing or hemoptysis. He is not reporting nausea, vomiting, abdominal pain, constipation, diarrhea or change in his bowel habits. He does not report any lymphadenopathy or petechiae. He does not report any skeletal complaints. Remaining review of systems unremarkable.   Medications: I have reviewed the patient's current medications.  Current Outpatient Prescriptions  Medication Sig Dispense Refill   . fluticasone (FLONASE) 50 MCG/ACT nasal spray Place 2 sprays into both nostrils daily.    . folic acid (FOLVITE) 093 MCG tablet Take 400 mcg by mouth at bedtime.     Marland Kitchen loratadine (CLARITIN) 10 MG tablet Take 10 mg by mouth daily.    . Multiple Vitamin (MULTIVITAMIN WITH MINERALS) TABS tablet Take 1 tablet by mouth at bedtime. Men's One-A-Day    . omeprazole (PRILOSEC) 40 MG capsule Take 40 mg by mouth at bedtime.      No current facility-administered medications for this visit.      Allergies: No Known Allergies  Past Medical History, Surgical history, Social history, and Family History were reviewed and updated.   Physical Exam: Blood pressure 133/87, pulse 69, temperature 97.9 F (36.6 C), temperature source Oral, resp. rate 18, height 6\' 1"  (1.854 m), weight 250 lb 4.8 oz (113.5 kg), SpO2 99 %. ECOG: 0 General appearance: Alert, awake, NAD Head: Normocephalic, without obvious abnormality Neck: no adenopathy Lymph nodes: Cervical, supraclavicular, and axillary nodes normal. Heart:regular rate and rhythm, S1, S2 normal, no murmur, click, rub or gallop Lung:chest clear, no wheezing, rales, normal symmetric air entry Abdomin: soft, non-tender, without masses or organomegaly.  EXT:no erythema, induration, or nodules   Lab Results: Lab Results  Component Value Date   WBC 5.5 04/13/2017   HGB 15.0 04/13/2017   HCT 43.3 04/13/2017   MCV 83.9 04/13/2017   PLT 208 04/13/2017     Chemistry      Component Value Date/Time   NA 138 01/01/2017 0922   K 4.4 01/01/2017 0922   CL 106 01/01/2017 0922   CO2 24 01/01/2017 0922   BUN 14 01/01/2017 0922   CREATININE 0.75 01/01/2017  1062      Component Value Date/Time   CALCIUM 8.8 (L) 01/01/2017 6948    IMPRESSION: 1. Hypermetabolic soft tissue in the left perirenal space is consistent with lymphoma. 2. Second small nodule in the retroperitoneum inferior to the lower pole of the LEFT kidney is consistent with lymphoma. 3. Small  round lesion adjacent to the RIGHT kidney is not hypermetabolic. Recommend attention on follow-up. 4. Small LEFT lower lobe pulmonary nodule is likely benign. Attention on follow-up. 5. No hypermetabolic or enlarged abdominal, iliac or pelvic lymph nodes. No thoracic adenopathy or neck adenopathy.  Diagnosis Soft tissue mass, simple excision, left perinephric - NON-HODGKIN'S B-CELL LYMPHOMA. - SEE ONCOLOGY TABLE. Microscopic Comment LYMPHOMA   Impression and Plan:   56 year old woman with the following issues:  1. 1.7 x 2.3 enhancing perirenal soft tissue lesion diagnosed in October 2017. He is status post biopsy done on 11/07/2016 which showed atypical lymphoid proliferation suspicious for lymphoma. The immunohistochemical staining suggest marginal zone lymphoma although there is no monoclonality by flow cytometry.  A repeat biopsy completed on 01/08/2007 none laparoscopically confirm the presence of marginal zone lymphoma.  His PET/CT scan on 12/05/2016 showed no evidence of wide spread disease.  He remains on active surveillance at this time. His physical examination and laboratory data from today did not support any evidence of recurrent disease. I have suggested continued active surveillance for the time being. We will repeat PET scan in 06/2017.   2. Follow-up: Will be in 4 months after PET scan.   [facility], MD 4/27/20188:31 AM

## 2017-05-19 NOTE — Addendum Note (Signed)
Addendum  created 05/19/17 0835 by Duane Boston, MD   Sign clinical note

## 2017-06-29 ENCOUNTER — Ambulatory Visit (HOSPITAL_COMMUNITY)
Admission: RE | Admit: 2017-06-29 | Discharge: 2017-06-29 | Disposition: A | Discharge status: Home / Self Care | Payer: BC Managed Care – PPO | Source: Ambulatory Visit | Attending: Oncology | Admitting: Oncology

## 2017-06-29 ENCOUNTER — Other Ambulatory Visit (HOSPITAL_BASED_OUTPATIENT_CLINIC_OR_DEPARTMENT_OTHER): Payer: BC Managed Care – PPO

## 2017-06-29 DIAGNOSIS — C9290 Myeloid leukemia, unspecified, not having achieved remission: Secondary | ICD-10-CM | POA: Diagnosis not present

## 2017-06-29 DIAGNOSIS — C8290 Follicular lymphoma, unspecified, unspecified site: Secondary | ICD-10-CM | POA: Diagnosis present

## 2017-06-29 DIAGNOSIS — R918 Other nonspecific abnormal finding of lung field: Secondary | ICD-10-CM | POA: Diagnosis not present

## 2017-06-29 DIAGNOSIS — K760 Fatty (change of) liver, not elsewhere classified: Secondary | ICD-10-CM | POA: Insufficient documentation

## 2017-06-29 DIAGNOSIS — K439 Ventral hernia without obstruction or gangrene: Secondary | ICD-10-CM | POA: Insufficient documentation

## 2017-06-29 LAB — CBC WITH DIFFERENTIAL/PLATELET
BASO%: 0.6 % (ref 0.0–2.0)
BASOS ABS: 0 10*3/uL (ref 0.0–0.1)
EOS ABS: 0.2 10*3/uL (ref 0.0–0.5)
EOS%: 2.9 % (ref 0.0–7.0)
HCT: 43.8 % (ref 38.4–49.9)
HGB: 15.1 g/dL (ref 13.0–17.1)
LYMPH%: 34 % (ref 14.0–49.0)
MCH: 29.6 pg (ref 27.2–33.4)
MCHC: 34.5 g/dL (ref 32.0–36.0)
MCV: 85.9 fL (ref 79.3–98.0)
MONO#: 0.5 10*3/uL (ref 0.1–0.9)
MONO%: 9 % (ref 0.0–14.0)
NEUT#: 2.9 10*3/uL (ref 1.5–6.5)
NEUT%: 53.5 % (ref 39.0–75.0)
PLATELETS: 199 10*3/uL (ref 140–400)
RBC: 5.1 10*6/uL (ref 4.20–5.82)
RDW: 13.1 % (ref 11.0–14.6)
WBC: 5.4 10*3/uL (ref 4.0–10.3)
lymph#: 1.9 10*3/uL (ref 0.9–3.3)

## 2017-06-29 LAB — COMPREHENSIVE METABOLIC PANEL
ALT: 151 U/L — ABNORMAL HIGH (ref 0–55)
ANION GAP: 10 meq/L (ref 3–11)
AST: 83 U/L — ABNORMAL HIGH (ref 5–34)
Albumin: 4.1 g/dL (ref 3.5–5.0)
Alkaline Phosphatase: 97 U/L (ref 40–150)
BILIRUBIN TOTAL: 0.57 mg/dL (ref 0.20–1.20)
BUN: 14.9 mg/dL (ref 7.0–26.0)
CHLORIDE: 106 meq/L (ref 98–109)
CO2: 24 meq/L (ref 22–29)
Calcium: 8.9 mg/dL (ref 8.4–10.4)
Creatinine: 0.8 mg/dL (ref 0.7–1.3)
GLUCOSE: 129 mg/dL (ref 70–140)
POTASSIUM: 4.2 meq/L (ref 3.5–5.1)
SODIUM: 140 meq/L (ref 136–145)
TOTAL PROTEIN: 7.2 g/dL (ref 6.4–8.3)

## 2017-06-29 LAB — GLUCOSE, CAPILLARY: Glucose-Capillary: 116 mg/dL — ABNORMAL HIGH (ref 65–99)

## 2017-06-29 MED ORDER — FLUDEOXYGLUCOSE F - 18 (FDG) INJECTION
12.4800 | Freq: Once | INTRAVENOUS | Status: AC | PRN
  Administered 2017-06-29: 12.48 via INTRAVENOUS

## 2017-07-03 ENCOUNTER — Ambulatory Visit (HOSPITAL_BASED_OUTPATIENT_CLINIC_OR_DEPARTMENT_OTHER): Payer: BC Managed Care – PPO | Admitting: Oncology

## 2017-07-03 ENCOUNTER — Telehealth: Payer: Self-pay | Admitting: Oncology

## 2017-07-03 VITALS — BP 146/86 | HR 65 | Temp 98.0°F | Resp 18 | Ht 73.0 in | Wt 253.5 lb

## 2017-07-03 DIAGNOSIS — Z8572 Personal history of non-Hodgkin lymphomas: Secondary | ICD-10-CM

## 2017-07-03 DIAGNOSIS — R918 Other nonspecific abnormal finding of lung field: Secondary | ICD-10-CM | POA: Diagnosis not present

## 2017-07-03 NOTE — Progress Notes (Signed)
Hematology and Oncology Follow Up Visit  TIA GELB [id number] 1961/03/21 56 y.o. 07/03/2017 8:25 AM [name], [name], MDBurdine, [name], MD   Principle Diagnosis: 56 year old gentleman with 1.7 x 2.3 cm mass in the pararenal space detected in October 2017. 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   Prior Therapy:  Status post needle biopsy done on 11/07/2016 which showed atypical lymphoid proliferation suspicious for lymphoma. The immunohistochemical staining suggest marginal zone lymphoma although there is no monoclonality by flow cytometry. More tissue is recommended to evaluate the process.  He is S/P robotic-assisted laparoscopic excision of a left perinephric mass done by Dr. Alinda Money on 01/08/2017. 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  Current therapy: Observation and surveillance.   Interim History: Mr. Shewell presents today for a follow-up visit. Since the last visit, he continues to be asymptomatic at this time. He does report a small incisional hernia which is not painful and easily reducible. He denied any hematuria or dysuria.  He denied any fevers or chills or sweats. He does not report any cough or abdominal pain. He denied any flank pain. He continues to attend his activities of daily living without any decline. He does report allergy symptoms and occasional cough but no recent infection or shortness of breath. He denied any hemoptysis or wheezing.  He does not report any headaches, blurry vision, syncope or seizures. He does not report any fevers, chills, sweats or weight loss. He does not report any chest pain, palpitation, orthopnea or leg edema. He is not reporting nausea, vomiting, abdominal pain, constipation, diarrhea or change in his bowel habits. He does not report any lymphadenopathy or petechiae. He does not report any skeletal complaints. Remaining review of systems unremarkable.   Medications: I have reviewed the  patient's current medications.  Current Outpatient Prescriptions  Medication Sig Dispense Refill  . fluticasone (FLONASE) 50 MCG/ACT nasal spray Place 2 sprays into both nostrils daily.    . folic acid (FOLVITE) 423 MCG tablet Take 400 mcg by mouth at bedtime.     Marland Kitchen loratadine (CLARITIN) 10 MG tablet Take 10 mg by mouth daily.    . Multiple Vitamin (MULTIVITAMIN WITH MINERALS) TABS tablet Take 1 tablet by mouth at bedtime. Men's One-A-Day    . omeprazole (PRILOSEC) 40 MG capsule Take 40 mg by mouth at bedtime.      No current facility-administered medications for this visit.      Allergies: No Known Allergies  Past Medical History, Surgical history, Social history, and Family History were reviewed and updated.   Physical Exam: Blood pressure (!) 146/86, pulse 65, temperature 98 F (36.7 C), temperature source Oral, resp. rate 18, height 6\' 1"  (1.854 m), weight 253 lb 8 oz (115 kg), SpO2 99 %. ECOG: 0 General appearance: Well-appearing gentleman appeared without distress. Head: Normocephalic, without obvious abnormality Neck: no adenopathy Lymph nodes: Cervical, supraclavicular, and axillary nodes normal. Heart:regular rate and rhythm, S1, S2 normal, no murmur, click, rub or gallop Lung:chest clear, no wheezing, rales, normal symmetric air entry Abdomin: soft, non-tender, without masses or organomegaly. No shifting dullness or ascites. EXT:no erythema, induration, or nodules   Lab Results: Lab Results  Component Value Date   WBC 5.4 06/29/2017   HGB 15.1 06/29/2017   HCT 43.8 06/29/2017   MCV 85.9 06/29/2017   PLT 199 06/29/2017     Chemistry      Component Value Date/Time   NA 140 06/29/2017 0745   K 4.2 06/29/2017 0745  CL 106 01/01/2017 0922   CO2 24 06/29/2017 0745   BUN 14.9 06/29/2017 0745   CREATININE 0.8 06/29/2017 0745      Component Value Date/Time   CALCIUM 8.9 06/29/2017 0745   ALKPHOS 97 06/29/2017 0745   AST 83 (H) 06/29/2017 0745   ALT 151 (H)  06/29/2017 0745   BILITOT 0.57 06/29/2017 0745      IMPRESSION: 1. Considerable improvement in the left perirenal space hypermetabolic nodules. The dominant nodule has nearly resolved, with only slight stranding at the location of the previous 1.8 cm nodule, and with complete resolution of associated hypermetabolic activity. The inferior nodule in the left perirenal space is mildly smaller and minimally less metabolic on today's exam. 2. Slight right perirenal space nodularity without hypermetabolic activity. 3. There is a new or significantly progressed 1.8 by 1.2 cm right upper lobe ground-glass opacity with maximum SUV of 2.7. This could be inflammatory or due to a low-grade malignant process, and merits surveillance. 4. Other imaging findings of potential clinical significance: Diffuse hepatic steatosis. Ventral hernia containing adipose tissue. 4 mm partially calcified left lower lobe nodule, stable and likely benign.    Impression and Plan:   56 year old woman with the following issues:  1. 1.7 x 2.3 enhancing perirenal soft tissue lesion diagnosed in October 2017. He is status post biopsy done on 11/07/2016 which showed atypical lymphoid proliferation suspicious for lymphoma. The immunohistochemical staining suggest marginal zone lymphoma although there is no monoclonality by flow cytometry.  A repeat biopsy completed on 01/08/2007 none laparoscopically confirm the presence of marginal zone lymphoma.  His PET/CT scan obtained on 06/29/2017 was reviewed today and showed no evidence of recurrent disease. I see no indication for systemic therapy at this time and I have recommended continued observation and surveillance.  2. Right upper lobe groundglass lung mass: Etiology unclear could be related to a low-grade malignancy. He is a nonsmoker with low risk of lung neoplasm. Inflammatory process could also be a possibility. I recommended continued observation and surveillance for  the time being and repeat imaging studies with a PET scan in 4 months. We have discussed the possibility of potential biopsy or surgical removal in the future if it continues to be PET avid.  3. Follow-up: Will be in 4 months after PET scan.   [facility], MD 7/17/20188:25 AM

## 2017-07-03 NOTE — Telephone Encounter (Signed)
Gave patient avs report and appointments for November. [facility] will call re scan.  °

## 2017-08-15 ENCOUNTER — Encounter: Payer: Self-pay | Admitting: Oncology

## 2017-08-15 ENCOUNTER — Telehealth: Payer: Self-pay | Admitting: *Deleted

## 2017-08-15 NOTE — Telephone Encounter (Signed)
Spoke with patient regarding his My chart message. To call us back ~ 11/1 if his scan has not been scheduled

## 2017-09-21 ENCOUNTER — Encounter: Payer: Self-pay | Admitting: Oncology

## 2017-10-26 ENCOUNTER — Other Ambulatory Visit (HOSPITAL_BASED_OUTPATIENT_CLINIC_OR_DEPARTMENT_OTHER): Payer: BC Managed Care – PPO

## 2017-10-26 ENCOUNTER — Ambulatory Visit (HOSPITAL_COMMUNITY)
Admission: RE | Admit: 2017-10-26 | Discharge: 2017-10-26 | Disposition: A | Discharge status: Home / Self Care | Payer: BC Managed Care – PPO | Source: Ambulatory Visit | Attending: Oncology | Admitting: Oncology

## 2017-10-26 DIAGNOSIS — J320 Chronic maxillary sinusitis: Secondary | ICD-10-CM | POA: Insufficient documentation

## 2017-10-26 DIAGNOSIS — K429 Umbilical hernia without obstruction or gangrene: Secondary | ICD-10-CM | POA: Diagnosis not present

## 2017-10-26 DIAGNOSIS — R918 Other nonspecific abnormal finding of lung field: Secondary | ICD-10-CM | POA: Insufficient documentation

## 2017-10-26 LAB — CBC WITH DIFFERENTIAL/PLATELET
BASO%: 1.1 % (ref 0.0–2.0)
Basophils Absolute: 0.1 10*3/uL (ref 0.0–0.1)
EOS%: 2.9 % (ref 0.0–7.0)
Eosinophils Absolute: 0.2 10*3/uL (ref 0.0–0.5)
HEMATOCRIT: 43.4 % (ref 38.4–49.9)
HEMOGLOBIN: 14.8 g/dL (ref 13.0–17.1)
LYMPH#: 1.9 10*3/uL (ref 0.9–3.3)
LYMPH%: 32.9 % (ref 14.0–49.0)
MCH: 29.2 pg (ref 27.2–33.4)
MCHC: 34.1 g/dL (ref 32.0–36.0)
MCV: 85.7 fL (ref 79.3–98.0)
MONO#: 0.6 10*3/uL (ref 0.1–0.9)
MONO%: 10 % (ref 0.0–14.0)
NEUT#: 3.1 10*3/uL (ref 1.5–6.5)
NEUT%: 53.1 % (ref 39.0–75.0)
Platelets: 214 10*3/uL (ref 140–400)
RBC: 5.06 10*6/uL (ref 4.20–5.82)
RDW: 13 % (ref 11.0–14.6)
WBC: 5.9 10*3/uL (ref 4.0–10.3)

## 2017-10-26 LAB — COMPREHENSIVE METABOLIC PANEL
ALBUMIN: 4 g/dL (ref 3.5–5.0)
ALK PHOS: 87 U/L (ref 40–150)
ALT: 116 U/L — AB (ref 0–55)
ANION GAP: 8 meq/L (ref 3–11)
AST: 62 U/L — ABNORMAL HIGH (ref 5–34)
BILIRUBIN TOTAL: 0.64 mg/dL (ref 0.20–1.20)
BUN: 13.5 mg/dL (ref 7.0–26.0)
CO2: 25 mEq/L (ref 22–29)
CREATININE: 0.8 mg/dL (ref 0.7–1.3)
Calcium: 9 mg/dL (ref 8.4–10.4)
Chloride: 106 mEq/L (ref 98–109)
Glucose: 146 mg/dl — ABNORMAL HIGH (ref 70–140)
Potassium: 4.3 mEq/L (ref 3.5–5.1)
Sodium: 139 mEq/L (ref 136–145)
TOTAL PROTEIN: 7.2 g/dL (ref 6.4–8.3)

## 2017-10-26 LAB — GLUCOSE, CAPILLARY: GLUCOSE-CAPILLARY: 143 mg/dL — AB (ref 65–99)

## 2017-10-26 MED ORDER — FLUDEOXYGLUCOSE F - 18 (FDG) INJECTION
12.5000 | Freq: Once | INTRAVENOUS | Status: AC | PRN
  Administered 2017-10-26: 12.5 via INTRAVENOUS

## 2017-10-30 ENCOUNTER — Ambulatory Visit (HOSPITAL_BASED_OUTPATIENT_CLINIC_OR_DEPARTMENT_OTHER): Payer: BC Managed Care – PPO | Admitting: Oncology

## 2017-10-30 ENCOUNTER — Telehealth: Payer: Self-pay | Admitting: Oncology

## 2017-10-30 DIAGNOSIS — Z8572 Personal history of non-Hodgkin lymphomas: Secondary | ICD-10-CM | POA: Diagnosis not present

## 2017-10-30 DIAGNOSIS — R918 Other nonspecific abnormal finding of lung field: Secondary | ICD-10-CM | POA: Diagnosis not present

## 2017-10-30 DIAGNOSIS — R911 Solitary pulmonary nodule: Secondary | ICD-10-CM

## 2017-10-30 NOTE — Telephone Encounter (Signed)
Gave avs and calendar for March 2019 °

## 2017-10-30 NOTE — Progress Notes (Signed)
Hematology and Oncology Follow Up Visit  Nicholas Pope [id number] [birth year] 56 y.o. 10/30/2017 8:25 AM Nicholas Pope, MDBurdine, [name], MD   Principle Diagnosis: 56 year old gentleman with:  1.1.7 x 2.3 cm mass in the pararenal space detected in October 2017. 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 2.  Right upper lobe lung nodule discovered in July 2018.  Low-grade malignancy versus reactive nodule is the differential.   Prior Therapy:  Status post needle biopsy done on 11/07/2016 which showed atypical lymphoid proliferation suspicious for lymphoma. The immunohistochemical staining suggest marginal zone lymphoma although there is no monoclonality by flow cytometry. More tissue is recommended to evaluate the process.  He is S/P robotic-assisted laparoscopic excision of a left perinephric mass done by Dr. Alinda Pope on 01/08/2017. 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  Current therapy: Observation and surveillance.   Interim History: [name] presents today for a follow-up visit. Since the last visit, he reports no changes in his health.  He continues to be active and attends activities of daily living. He denied any hematuria or dysuria.  He denied any fevers or chills or sweats. He does not report any cough or abdominal pain. He denied any hemoptysis or wheezing.  His incisional hernia has not changed since last visit.  No abdominal discomfort or change in his bowel habits.  He does not report any headaches, blurry vision, syncope or seizures. He does not report any fevers, chills, sweats or weight loss. He does not report any chest pain, palpitation, orthopnea or leg edema. He is not reporting nausea, vomiting, abdominal pain, constipation, diarrhea or change in his bowel habits. He does not report any lymphadenopathy or petechiae. He does not report any skeletal complaints. Remaining review of systems unremarkable.   Medications: I have  reviewed the patient's current medications.  Current Outpatient Medications  Medication Sig Dispense Refill  . fluticasone (FLONASE) 50 MCG/ACT nasal spray Place 2 sprays into both nostrils daily.    . folic acid (FOLVITE) 614 MCG tablet Take 400 mcg by mouth at bedtime.     Marland Kitchen loratadine (CLARITIN) 10 MG tablet Take 10 mg by mouth daily.    . Multiple Vitamin (MULTIVITAMIN WITH MINERALS) TABS tablet Take 1 tablet by mouth at bedtime. Men's One-A-Day    . omeprazole (PRILOSEC) 40 MG capsule Take 40 mg by mouth at bedtime.      No current facility-administered medications for this visit.      Allergies: No Known Allergies  Past Medical History, Surgical history, Social history, and Family History were reviewed and updated.   Physical Exam: Blood pressure (!) 144/92, pulse 85, temperature 98 F (36.7 C), temperature source Oral, resp. rate 18, height 6\' 1"  (1.854 m), weight 252 lb 12.8 oz (114.7 kg), SpO2 97 %. ECOG: 0 General appearance: Alert, awake gentleman without distress. Head: Normocephalic, without obvious abnormality no oral thrush or ulcers. Neck: no adenopathy Lymph nodes: Cervical, supraclavicular, and axillary nodes normal. Heart:regular rate and rhythm, S1, S2 normal, no murmur, click, rub or gallop Lung:chest clear, no wheezing, rales, normal symmetric air entry Abdomin: soft, non-tender, without masses or organomegaly. No rebound or guarding. EXT:no erythema, induration, or nodules   Lab Results: Lab Results  Component Value Date   WBC 5.9 10/26/2017   HGB 14.8 10/26/2017   HCT 43.4 10/26/2017   MCV 85.7 10/26/2017   PLT 214 10/26/2017     Chemistry      Component Value Date/Time   NA  139 10/26/2017 0741   K 4.3 10/26/2017 0741   CL 106 01/01/2017 0922   CO2 25 10/26/2017 0741   BUN 13.5 10/26/2017 0741   CREATININE 0.8 10/26/2017 0741      Component Value Date/Time   CALCIUM 9.0 10/26/2017 0741   ALKPHOS 87 10/26/2017 0741   AST 62 (H) 10/26/2017  0741   ALT 116 (H) 10/26/2017 0741   BILITOT 0.64 10/26/2017 0741      EXAM: NUCLEAR MEDICINE PET SKULL BASE TO THIGH  TECHNIQUE: 12.6 mCi F-18 FDG was injected intravenously. Full-ring PET imaging was performed from the skull base to thigh after the radiotracer. CT data was obtained and used for attenuation correction and anatomic localization.  FASTING BLOOD GLUCOSE:  Value: 143 mg/dl  COMPARISON:  06/29/2017  FINDINGS: NECK  No hypermetabolic lymph nodes in the neck.  Chronic left maxillary sinusitis.  CHEST  The 2.0 by 1.0 cm ground-glass density in the right upper lobe has a similar morphology to previous and has a maximum standard uptake value of 2.8 (formerly 2.7). Partially calcified 4 mm subpleural nodule in the left lower lobe on image 49/8 is most likely a granuloma. Small upper mediastinal and paratracheal lymph nodes have a background activity similar to that of the surrounding blood pool. Background blood pool activity has an SUV of 3.2.  ABDOMEN/PELVIS  The kidneys have a normal appearance. No abnormal activity in the liver, spleen, pancreas, or adrenal glands. Physiologic activity in the bowel. There are several small lymph nodes in the perirenal space bilaterally, largest a 6 mm node on image 149/4 in the left inferior perirenal space with maximum SUV 1.4 (formerly 4.7 on 06/29/2017). There appears to be a small potentially partially retracted left testicle which is not hypermetabolic. A left external iliac node measures 0.9 cm in short axis and is not appear hypermetabolic.  SKELETON  No focal hypermetabolic activity to suggest skeletal metastasis.  A left paracentral supraumbilical hernia contains omental adipose tissue. Degenerative disc disease at L5-S1.  IMPRESSION: 1. Further reduction in activity of the small left perirenal space lymph nodes, with the left inferior perirenal space noted lowering in maximum SUV from  previous measurement of 4.7 the current measurement of 1.4. 2. Essentially stable appearance of the 2.0 by 1.0 cm right upper lobe ground-glass density nodule, maximum SUV 2.8, raising suspicion for the possibility of a synchronous low-grade adenocarcinoma or unusual pulmonary involvement by lymphoma. The lack of interval resolution of this lesion and accentuated metabolic activity make this unlikely to be an inflammatory lesion. 3. Chronic left maxillary sinusitis. 4. Left paracentral supraumbilical hernia containing omental adipose tissue.    Impression and Plan:   56 year old woman with the following issues:  1. 1.7 x 2.3 enhancing perirenal soft tissue lesion diagnosed in October 2017. He is status post biopsy done on 11/07/2016 which showed atypical lymphoid proliferation suspicious for lymphoma. The immunohistochemical staining suggest marginal zone lymphoma although there is no monoclonality by flow cytometry.  A repeat biopsy completed on 01/08/2007 none laparoscopically confirm the presence of marginal zone lymphoma.  PET/CT scan obtained on 10/26/2017 was reviewed today and showed no evidence of recurrent disease.   There is no indication to start treatment at this time and likely will have very little clinical consequence is moving forward.  2. Right upper lobe groundglass lung mass: Etiology could be related to a low-grade malignancy. He is a nonsmoker with low risk of lung neoplasm. Inflammatory process could also be a possibility.   Options  of therapy were reviewed today given the fact that this nodule has persisted over the last 4 months versus surgical consultation and removal of this nodule was discussed today.  After discussion, we have elected to proceed with repeat imaging studies and consideration for surgical removal if this nodule persists.  3. Follow-up: Will be in 4 months after PET scan.   Zola Button, MD 11/13/20188:25 AM

## 2018-02-26 ENCOUNTER — Inpatient Hospital Stay: Payer: BC Managed Care – PPO | Attending: Oncology

## 2018-02-26 ENCOUNTER — Ambulatory Visit (HOSPITAL_COMMUNITY)
Admission: RE | Admit: 2018-02-26 | Discharge: 2018-02-26 | Disposition: A | Discharge status: Home / Self Care | Payer: BC Managed Care – PPO | Source: Ambulatory Visit | Attending: Oncology | Admitting: Oncology

## 2018-02-26 DIAGNOSIS — R911 Solitary pulmonary nodule: Secondary | ICD-10-CM

## 2018-02-26 DIAGNOSIS — Z8572 Personal history of non-Hodgkin lymphomas: Secondary | ICD-10-CM | POA: Diagnosis not present

## 2018-02-26 DIAGNOSIS — R935 Abnormal findings on diagnostic imaging of other abdominal regions, including retroperitoneum: Secondary | ICD-10-CM | POA: Diagnosis not present

## 2018-02-26 DIAGNOSIS — R7989 Other specified abnormal findings of blood chemistry: Secondary | ICD-10-CM | POA: Insufficient documentation

## 2018-02-26 LAB — COMPREHENSIVE METABOLIC PANEL
ALK PHOS: 96 U/L (ref 40–150)
ALT: 82 U/L — ABNORMAL HIGH (ref 0–55)
ANION GAP: 9 (ref 3–11)
AST: 45 U/L — ABNORMAL HIGH (ref 5–34)
Albumin: 4.3 g/dL (ref 3.5–5.0)
BUN: 21 mg/dL (ref 7–26)
CALCIUM: 9.5 mg/dL (ref 8.4–10.4)
CO2: 25 mmol/L (ref 22–29)
Chloride: 105 mmol/L (ref 98–109)
Creatinine, Ser: 0.89 mg/dL (ref 0.70–1.30)
GFR calc non Af Amer: >60 mL/min (ref >60)
Glucose, Bld: 118 mg/dL (ref 70–140)
POTASSIUM: 4.4 mmol/L (ref 3.5–5.1)
SODIUM: 139 mmol/L (ref 136–145)
TOTAL PROTEIN: 8 g/dL (ref 6.4–8.3)
Total Bilirubin: 0.5 mg/dL (ref 0.2–1.2)

## 2018-02-26 LAB — CBC WITH DIFFERENTIAL/PLATELET
BASOS ABS: 0.1 10*3/uL (ref 0.0–0.1)
BASOS PCT: 1 %
Eosinophils Absolute: 0.2 10*3/uL (ref 0.0–0.5)
Eosinophils Relative: 2 %
HEMATOCRIT: 47 % (ref 38.4–49.9)
HEMOGLOBIN: 15.9 g/dL (ref 13.0–17.1)
Lymphocytes Relative: 29 %
Lymphs Abs: 2 10*3/uL (ref 0.9–3.3)
MCH: 28.8 pg (ref 27.2–33.4)
MCHC: 33.8 g/dL (ref 32.0–36.0)
MCV: 85.2 fL (ref 79.3–98.0)
MONOS PCT: 10 %
Monocytes Absolute: 0.7 10*3/uL (ref 0.1–0.9)
NEUTROS ABS: 4 10*3/uL (ref 1.5–6.5)
NEUTROS PCT: 58 %
Platelets: 235 10*3/uL (ref 140–400)
RBC: 5.51 MIL/uL (ref 4.20–5.82)
RDW: 13.1 % (ref 11.0–14.6)
WBC: 6.9 10*3/uL (ref 4.0–10.3)

## 2018-02-26 LAB — GLUCOSE, CAPILLARY: Glucose-Capillary: 131 mg/dL — ABNORMAL HIGH (ref 65–99)

## 2018-02-26 MED ORDER — FLUDEOXYGLUCOSE F - 18 (FDG) INJECTION
12.4000 | Freq: Once | INTRAVENOUS | Status: AC | PRN
  Administered 2018-02-26: 12.4 via INTRAVENOUS

## 2018-02-28 ENCOUNTER — Inpatient Hospital Stay (HOSPITAL_BASED_OUTPATIENT_CLINIC_OR_DEPARTMENT_OTHER): Payer: BC Managed Care – PPO | Admitting: Oncology

## 2018-02-28 ENCOUNTER — Telehealth: Payer: Self-pay | Admitting: Oncology

## 2018-02-28 VITALS — BP 147/78 | HR 67 | Temp 97.6°F | Resp 18 | Ht 73.0 in | Wt 243.7 lb

## 2018-02-28 DIAGNOSIS — Z8572 Personal history of non-Hodgkin lymphomas: Secondary | ICD-10-CM

## 2018-02-28 DIAGNOSIS — R7989 Other specified abnormal findings of blood chemistry: Secondary | ICD-10-CM

## 2018-02-28 DIAGNOSIS — C8380 Other non-follicular lymphoma, unspecified site: Secondary | ICD-10-CM

## 2018-02-28 NOTE — Progress Notes (Signed)
Hematology and Oncology Follow Up Visit  [name] [id number] 05/19/1961 57 y.o. 02/28/2018 8:37 AM [name], Virgina Pope, MDBurdine, [name], MD   Principle Diagnosis: 57 year old gentleman with:  1. 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  He presented with 1.7 x 2.3 cm mass in the pararenal space.   2.  Right upper lobe lung nodule discovered in July 2018.  Follow-up PET scan showed resolution of these findings indicating benign etiology.   Prior Therapy:  Status post needle biopsy done on 11/07/2016 that was not diagnostic.   He is S/P robotic-assisted laparoscopic excision of a left perinephric mass done by Dr. Alinda Money on 01/08/2017. 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  Current therapy: Active surveillance.  Interim History: [name] is here for a follow-up visit.  Since last visit, he continues to report no recent complaints or issues.  His appetite have been excellent and his performance status has been unchanged.  He denies any abdominal pain, early satiety.  He continues to work full-time and his job is rather physical and have complaint of right hip pain related to it.  He denies any respiratory symptoms including cough, wheezing or hemoptysis.  He denies any hematuria or dysuria.  His activity level remained maintained.  He does not report any headaches, blurry vision, syncope or seizures. He does not report any fevers, chills, sweats or weight loss. He does not report any chest pain, palpitation, orthopnea or leg edema. He is not reporting nausea, vomiting, abdominal pain, constipation, diarrhea or change in his bowel habits. He does not report any lymphadenopathy or petechiae.  He does not report any skin rashes or lesions.  He denies any anxiety or depression.  Remaining review of systems is negative.  Medications: I have reviewed the patient's current medications.  Current Outpatient Medications  Medication Sig  Dispense Refill  . fluticasone (FLONASE) 50 MCG/ACT nasal spray Place 2 sprays into both nostrils daily.    . folic acid (FOLVITE) 497 MCG tablet Take 400 mcg by mouth at bedtime.     Marland Kitchen loratadine (CLARITIN) 10 MG tablet Take 10 mg by mouth daily.    . Multiple Vitamin (MULTIVITAMIN WITH MINERALS) TABS tablet Take 1 tablet by mouth at bedtime. Men's One-A-Day    . omeprazole (PRILOSEC) 40 MG capsule Take 40 mg by mouth at bedtime.      No current facility-administered medications for this visit.      Allergies: No Known Allergies  Past Medical History, Surgical history, Social history, and Family History were reviewed and updated.   Physical Exam: Blood pressure (!) 147/78, pulse 67, temperature 97.6 F (36.4 C), temperature source Oral, resp. rate 18, height 6\' 1"  (1.854 m), weight 243 lb 11.2 oz (110.5 kg), SpO2 100 %. ECOG: 0 General appearance: Well-appearing gentleman appeared without distress. Head: Atraumatic without abnormalities. Oropharynx: No mucous lesions or ulcerations. Eyes: Sclera anicteric. Lymph nodes: No lymphadenopathy palpated in the cervical, supraclavicular or axillary lymph nodes. Heart: Regular rate and rhythm without murmurs rubs or gallops. Lung: Clear to auscultation without any rhonchi or wheezes or dullness to percussion. Abdomin: Soft, nontender without any shifting dullness or ascites. Musculoskeletal: No joint deformity or effusion. Skin: No rashes or lesions.   Lab Results: Lab Results  Component Value Date   WBC 6.9 02/26/2018   HGB 15.9 02/26/2018   HCT 47.0 02/26/2018   MCV 85.2 02/26/2018   PLT 235 02/26/2018     Chemistry  Component Value Date/Time   NA 139 02/26/2018 0744   NA 139 10/26/2017 0741   K 4.4 02/26/2018 0744   K 4.3 10/26/2017 0741   CL 105 02/26/2018 0744   CO2 25 02/26/2018 0744   CO2 25 10/26/2017 0741   BUN 21 02/26/2018 0744   BUN 13.5 10/26/2017 0741   CREATININE 0.89 02/26/2018 0744   CREATININE 0.8  10/26/2017 0741      Component Value Date/Time   CALCIUM 9.5 02/26/2018 0744   CALCIUM 9.0 10/26/2017 0741   ALKPHOS 96 02/26/2018 0744   ALKPHOS 87 10/26/2017 0741   AST 45 (H) 02/26/2018 0744   AST 62 (H) 10/26/2017 0741   ALT 82 (H) 02/26/2018 0744   ALT 116 (H) 10/26/2017 0741   BILITOT 0.5 02/26/2018 0744   BILITOT 0.64 10/26/2017 0741     EXAM: NUCLEAR MEDICINE PET SKULL BASE TO THIGH  TECHNIQUE: 12.4 mCi F-18 FDG was injected intravenously. Full-ring PET imaging was performed from the skull base to thigh after the radiotracer. CT data was obtained and used for attenuation correction and anatomic localization.  Fasting blood glucose: 131 mg/dl  Mediastinal blood pool activity: SUV max 3.2  COMPARISON:  PET-CT 10/26/2018  FINDINGS: NECK: No hypermetabolic lymph nodes in the neck.  Incidental CT findings: none  CHEST: Ground-glass nodule in the RIGHT upper lobe is less conspicuous and with no associated metabolic activity. The nodule is near completely resolved and subtly evident on image 22, series 8. No additional pulmonary nodules. No hypermetabolic mediastinal lymph nodes.  Incidental CT findings: none  ABDOMEN/PELVIS: Small LEFT pararenal space nodular lesion is again demonstrated measuring 10 mm with SUV max equal 2.4 which compares to SUV max equal 1.4. With no change in size. This activity is similar to mediastinal blood pool activity. Similar small 6 mm and 7 mm implants in the RIGHT pararenal space (image 141, series 4). These have even more faint radiotracer activity. No enlarged hypermetabolic abdominopelvic lymph nodes. Normal spleen.  Physiologic activity in the testicles.  Incidental CT findings: none  SKELETON: No focal hypermetabolic activity to suggest skeletal metastasis.  Incidental CT findings: none  IMPRESSION: 1. Near complete resolution ground-glass nodule in the RIGHT upper lobe. 2. Persistent faint metabolic  activity associated with small retroperitoneal nodules in the perirenal space with metabolic activity similar to background blood pool activity ( [place] 1). 3. No evidence of progressive disease    Impression and Plan:   57 year old woman with the following issues:  1.  Marginal zone lymphoma presented with 1.7 x 2.3 perirenal soft tissue lesion diagnosed in October 2017.  He has been on active surveillance since that time given her the near resolution of this lesion after excisional biopsy.  PET/CT scan obtained on 02/26/2018 was personally reviewed today and discussed with the patient.  His PET scan did not show any progression of this lesion or any other lymphadenopathy.  The natural course of this disease was reviewed again in detail with the patient and his wife.  Despite the fact that he has very little to no residual disease in the retroperitoneal space, he is at risk of developing progression in the future that might require systemic treatment.  The plan is to continue with active surveillance at this time with physical examination and periodic imaging studies.  2. Right upper lobe lung mass: PET/CT scan on February 26, 2018 showed resolution of the right upper lobe nodule.  This makes this finding less likely to be of a malignant  etiology and likely reactive or infectious process.  No further evaluation is needed at this time.  He will have repeat imaging studies of the chest as a part of his lymphoma follow-up in the future.  3.  Elevated liver function test: He is aware of this finding from previous times and his liver function test actually improving.  This could be from fatty infiltration of the liver.  4. Follow-up: Will be in 6 months for a repeat scan and a physical examination.  15  minutes was spent with the patient face-to-face today.  More than 50% of time was dedicated to patient counseling, education and answering questioning regarding his diagnosis and imaging  studies.  Zola Button, MD 3/14/20198:37 AM

## 2018-02-28 NOTE — Telephone Encounter (Signed)
Scheduled appt per 3/14 los - Gave patient AVS and calender per los. Central radiology to contact patient with ct scan.

## 2018-03-02 IMAGING — CR DG CHEST 2V
2 series · 2 of 2 positions shown · non-contrast
Comparison: PET-CT [DATE]

CLINICAL DATA: Preop evaluation.

EXAM:
CHEST  2 VIEW

[w chest pa *]
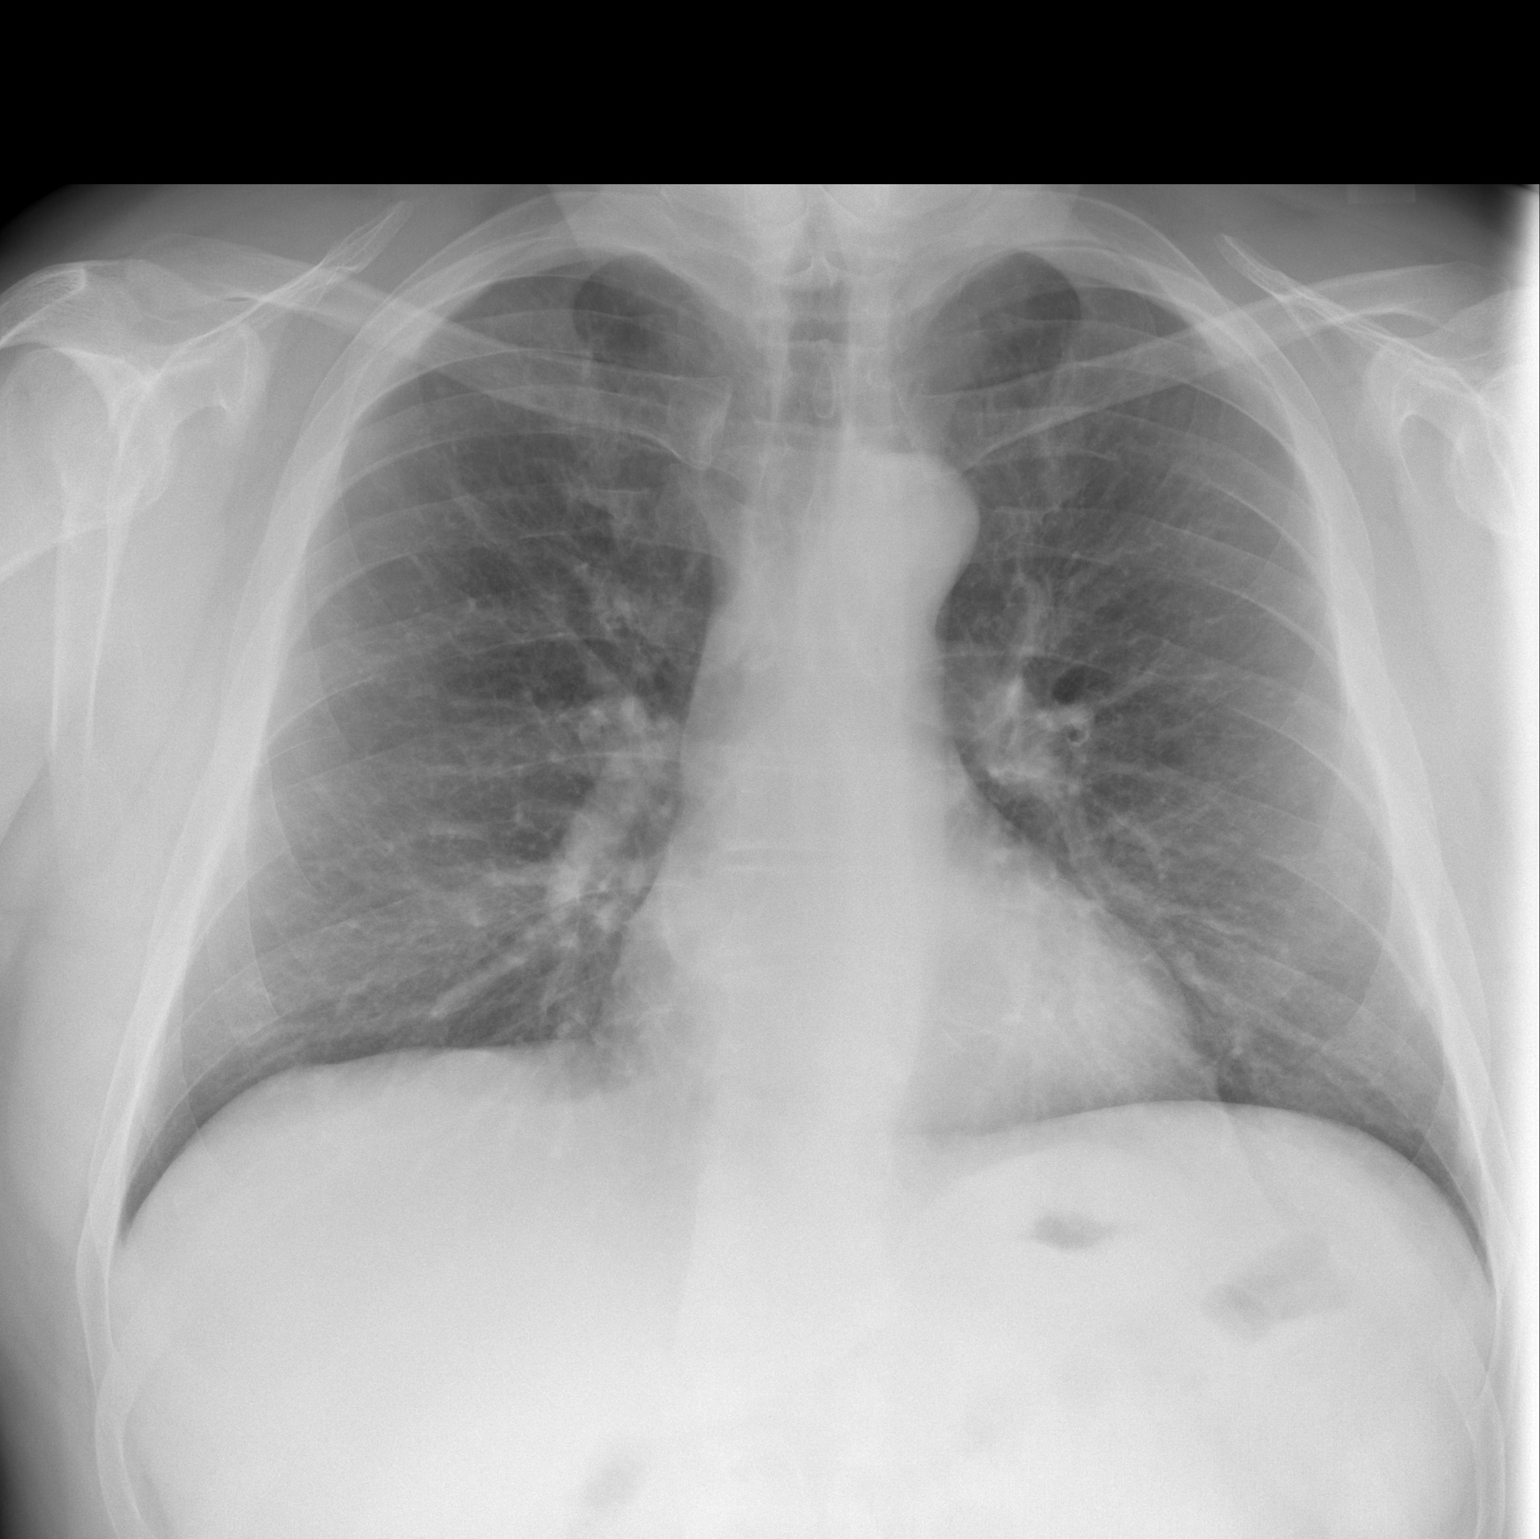

[w chest lat *]
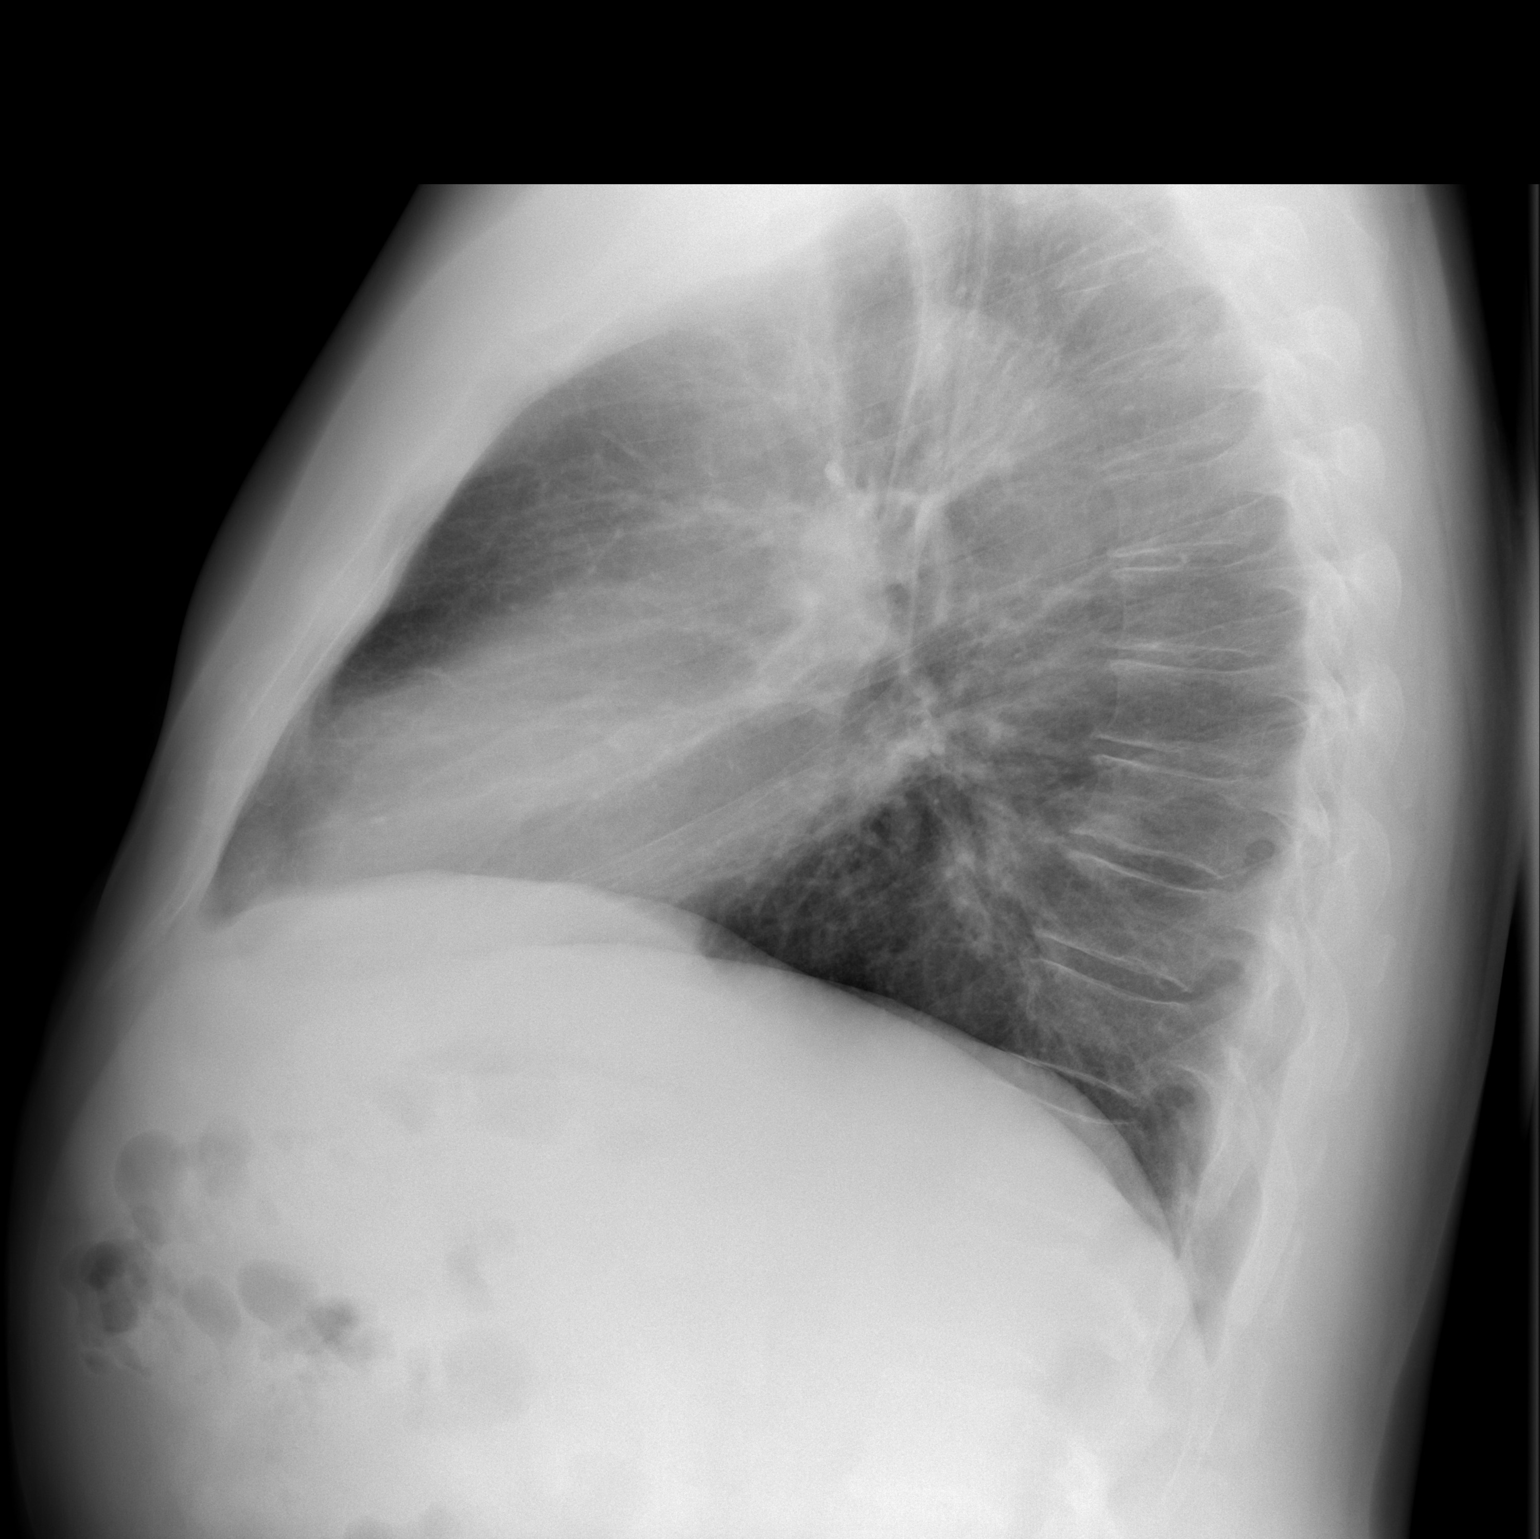

[2 of 2 positions shown; findings below may reference images not displayed]

FINDINGS: Lungs are adequately inflated without consolidation or effusion.
Cardiomediastinal silhouette is within normal. Bones and soft
tissues are within normal.
IMPRESSION: No active cardiopulmonary disease.

## 2018-09-04 ENCOUNTER — Inpatient Hospital Stay: Payer: BC Managed Care – PPO | Attending: Oncology

## 2018-09-04 ENCOUNTER — Ambulatory Visit (HOSPITAL_COMMUNITY)
Admission: RE | Admit: 2018-09-04 | Discharge: 2018-09-04 | Disposition: A | Discharge status: Home / Self Care | Payer: BC Managed Care – PPO | Source: Ambulatory Visit | Attending: Oncology | Admitting: Oncology

## 2018-09-04 ENCOUNTER — Encounter (HOSPITAL_COMMUNITY): Payer: Self-pay

## 2018-09-04 DIAGNOSIS — R590 Localized enlarged lymph nodes: Secondary | ICD-10-CM | POA: Diagnosis not present

## 2018-09-04 DIAGNOSIS — C8380 Other non-follicular lymphoma, unspecified site: Secondary | ICD-10-CM

## 2018-09-04 DIAGNOSIS — R918 Other nonspecific abnormal finding of lung field: Secondary | ICD-10-CM | POA: Insufficient documentation

## 2018-09-04 DIAGNOSIS — R7989 Other specified abnormal findings of blood chemistry: Secondary | ICD-10-CM | POA: Insufficient documentation

## 2018-09-04 DIAGNOSIS — C884 Extranodal marginal zone B-cell lymphoma of mucosa-associated lymphoid tissue [MALT-lymphoma]: Secondary | ICD-10-CM | POA: Diagnosis present

## 2018-09-04 LAB — CMP (CANCER CENTER ONLY)
ALBUMIN: 4.2 g/dL (ref 3.5–5.0)
ALK PHOS: 92 U/L (ref 38–126)
ALT: 96 U/L — AB (ref 0–44)
AST: 62 U/L — AB (ref 15–41)
Anion gap: 11 (ref 5–15)
BILIRUBIN TOTAL: 0.5 mg/dL (ref 0.3–1.2)
BUN: 17 mg/dL (ref 6–20)
CALCIUM: 9.3 mg/dL (ref 8.9–10.3)
CO2: 24 mmol/L (ref 22–32)
CREATININE: 0.87 mg/dL (ref 0.61–1.24)
Chloride: 106 mmol/L (ref 98–111)
GFR, Est AFR Am: >60 mL/min (ref >60)
GFR, Estimated: >60 mL/min (ref >60)
GLUCOSE: 134 mg/dL — AB (ref 70–99)
Potassium: 4.6 mmol/L (ref 3.5–5.1)
SODIUM: 141 mmol/L (ref 135–145)
TOTAL PROTEIN: 7.9 g/dL (ref 6.5–8.1)

## 2018-09-04 LAB — CBC WITH DIFFERENTIAL (CANCER CENTER ONLY)
BASOS ABS: 0 10*3/uL (ref 0.0–0.1)
BASOS PCT: 0 %
Eosinophils Absolute: 0.1 10*3/uL (ref 0.0–0.5)
Eosinophils Relative: 2 %
HCT: 43.7 % (ref 38.4–49.9)
Hemoglobin: 15 g/dL (ref 13.0–17.1)
Lymphocytes Relative: 17 %
Lymphs Abs: 1.4 10*3/uL (ref 0.9–3.3)
MCH: 29.4 pg (ref 27.2–33.4)
MCHC: 34.3 g/dL (ref 32.0–36.0)
MCV: 85.7 fL (ref 79.3–98.0)
MONO ABS: 0.8 10*3/uL (ref 0.1–0.9)
MONOS PCT: 10 %
Neutro Abs: 5.8 10*3/uL (ref 1.5–6.5)
Neutrophils Relative %: 71 %
PLATELETS: 197 10*3/uL (ref 140–400)
RBC: 5.1 MIL/uL (ref 4.20–5.82)
RDW: 13 % (ref 11.0–14.6)
WBC Count: 8.2 10*3/uL (ref 4.0–10.3)

## 2018-09-04 MED ORDER — IOHEXOL 300 MG/ML  SOLN
100.0000 mL | Freq: Once | INTRAMUSCULAR | Status: AC | PRN
  Administered 2018-09-04: 100 mL via INTRAVENOUS

## 2018-09-06 ENCOUNTER — Inpatient Hospital Stay (HOSPITAL_BASED_OUTPATIENT_CLINIC_OR_DEPARTMENT_OTHER): Payer: BC Managed Care – PPO | Admitting: Oncology

## 2018-09-06 ENCOUNTER — Telehealth: Payer: Self-pay

## 2018-09-06 VITALS — BP 158/90 | HR 69 | Temp 97.8°F | Resp 17 | Ht 73.0 in | Wt 249.7 lb

## 2018-09-06 DIAGNOSIS — R918 Other nonspecific abnormal finding of lung field: Secondary | ICD-10-CM | POA: Diagnosis not present

## 2018-09-06 DIAGNOSIS — R7989 Other specified abnormal findings of blood chemistry: Secondary | ICD-10-CM

## 2018-09-06 DIAGNOSIS — C884 Extranodal marginal zone B-cell lymphoma of mucosa-associated lymphoid tissue [MALT-lymphoma]: Secondary | ICD-10-CM | POA: Diagnosis not present

## 2018-09-06 DIAGNOSIS — C8380 Other non-follicular lymphoma, unspecified site: Secondary | ICD-10-CM

## 2018-09-06 NOTE — Telephone Encounter (Signed)
Printed avs and calender of upcoming appointment. per 9/20 los 

## 2018-09-06 NOTE — Progress Notes (Signed)
Hematology and Oncology Follow Up Visit  Nicholas Pope [id number] 08/17/1961 57 y.o. 09/06/2018 8:11 AM [name], Nicholas Pope, MDBurdine, Nicholas Evener, MD   Principle Diagnosis: 57 year old man with:  1.  Low-grade lymphoma diagnosed in October 2017.  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  2.  Right upper lobe lung nodule discovered in July 2018.  Follow-up PET scan showed resolution of these findings indicating benign etiology.   Prior Therapy:  Status post needle biopsy done on 11/07/2016 that was not diagnostic.   He is S/P robotic-assisted laparoscopic excision of a left perinephric mass done by Dr. Alinda Money on 01/08/2017. 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  Current therapy: Active surveillance.  Interim History: Nicholas Pope presents today for a follow-up.  Since the last visit, he reports no major changes or complaints.  He remains active and attends to activities of daily living.  He denies any abdominal distention or loss of appetite.  He is eating well and have gained weight.  He denies any palpable adenopathy or constitutional symptoms.  He does have upper respiratory tract infection and does report mild cough.  He denies any fevers or shortness of breath.  He does not report any headaches, blurry vision, syncope or seizures.  He denies any dizziness or alteration in mental status.  He does not report any fevers, chills, sweats or weight loss. He does not report any chest pain, palpitation, orthopnea or leg edema. He is not reporting nausea, vomiting, abdominal pain.  He denies any hematochezia or melena.  He denies any flank pain or dysuria.  He does not report any lymphadenopathy or petechiae.  He does not report any skin rashes or lesions.  He denies any bleeding or clotting tendency.  Remaining review of systems is negative.  Medications: I have reviewed the patient's current  medications.  Current Outpatient Medications  Medication Sig Dispense Refill  . fluticasone (FLONASE) 50 MCG/ACT nasal spray Place 2 sprays into both nostrils daily.    . folic acid (FOLVITE) 622 MCG tablet Take 400 mcg by mouth at bedtime.     Marland Kitchen loratadine (CLARITIN) 10 MG tablet Take 10 mg by mouth daily.    . Multiple Vitamin (MULTIVITAMIN WITH MINERALS) TABS tablet Take 1 tablet by mouth at bedtime. Men's One-A-Day    . omeprazole (PRILOSEC) 40 MG capsule Take 40 mg by mouth at bedtime.      No current facility-administered medications for this visit.      Allergies: No Known Allergies  Past Medical History, Surgical history, Social history, and Family History were reviewed and updated.   Physical Exam: Blood pressure (!) 158/90, pulse 69, temperature 97.8 F (36.6 C), temperature source Oral, resp. rate 17, height 6\' 1"  (1.854 m), weight 249 lb 11.2 oz (113.3 kg), SpO2 99 %.   ECOG: 0   General appearance: Comfortable appearing without any discomfort Head: Normocephalic without any trauma Oropharynx: Mucous membranes are moist and pink without any thrush or ulcers. Eyes: Pupils are equal and round reactive to light. Lymph nodes: No cervical, supraclavicular, inguinal or axillary lymphadenopathy.   Heart:regular rate and rhythm.  S1 and S2 without leg edema. Lung: Clear without any rhonchi or wheezes.  No dullness to percussion. Abdomin: Soft, nontender, nondistended with good bowel sounds.  No hepatosplenomegaly. Musculoskeletal: No joint deformity or effusion.  Full range of motion noted. Neurological: No deficits noted on motor, sensory  and deep tendon reflex exam. Skin: No petechial rash or dryness.  Appeared moist.     Lab Results: Lab Results  Component Value Date   WBC 8.2 09/04/2018   HGB 15.0 09/04/2018   HCT 43.7 09/04/2018   MCV 85.7 09/04/2018   PLT 197 09/04/2018     Chemistry      Component Value Date/Time   NA 141 09/04/2018 0803   NA 139  10/26/2017 0741   K 4.6 09/04/2018 0803   K 4.3 10/26/2017 0741   CL 106 09/04/2018 0803   CO2 24 09/04/2018 0803   CO2 25 10/26/2017 0741   BUN 17 09/04/2018 0803   BUN 13.5 10/26/2017 0741   CREATININE 0.87 09/04/2018 0803   CREATININE 0.8 10/26/2017 0741      Component Value Date/Time   CALCIUM 9.3 09/04/2018 0803   CALCIUM 9.0 10/26/2017 0741   ALKPHOS 92 09/04/2018 0803   ALKPHOS 87 10/26/2017 0741   AST 62 (H) 09/04/2018 0803   AST 62 (H) 10/26/2017 0741   ALT 96 (H) 09/04/2018 0803   ALT 116 (H) 10/26/2017 0741   BILITOT 0.5 09/04/2018 0803   BILITOT 0.64 10/26/2017 0741      EXAM: CT CHEST, ABDOMEN, AND PELVIS WITH CONTRAST  TECHNIQUE: Multidetector CT imaging of the chest, abdomen and pelvis was performed following the standard protocol during bolus administration of intravenous contrast.  CONTRAST:  182mL OMNIPAQUE IOHEXOL 300 MG/ML  SOLN  COMPARISON:  02/26/2018 PET-CT.  FINDINGS: CT CHEST FINDINGS  Cardiovascular: Normal heart size. No significant pericardial effusion/thickening. Great vessels are normal in course and caliber. No central pulmonary emboli.  Mediastinum/Nodes: No discrete thyroid nodules. Unremarkable esophagus. No axillary adenopathy. Mildly enlarged 1.0 cm posterior paraesophageal mediastinal node (series 2/image 25), stable in size since 02/26/2018 PET-CT, where it was non FDG avid. No new pathologically enlarged mediastinal nodes. No hilar adenopathy.  Lungs/Pleura: No pneumothorax. No pleural effusion. Solid 1.0 cm medial right lower lobe pulmonary nodule (series 6/image 84) is stable since 12/05/2016 PET-CT study, where it was non hypermetabolic, most compatible with a benign nodule. Peripheral left lower lobe subcentimeter calcified granuloma is stable. No acute consolidative airspace disease, lung masses or new significant pulmonary nodules. Minimal patchy peribronchovascular ground-glass opacity in the anterior right  upper lobe (series 6/image 42) is new from prior PET-CT, probably inflammatory (previously noted faint right upper lobe ground-glass opacities on the recent PET-CT inferior largely resolved).  Musculoskeletal: No aggressive appearing focal osseous lesions. Mild thoracic spondylosis.  CT ABDOMEN PELVIS FINDINGS  Hepatobiliary: Normal liver with no liver mass. Normal gallbladder with no radiopaque cholelithiasis. No biliary ductal dilatation.  Pancreas: Normal, with no mass or duct dilation.  Spleen: Normal size. No mass.  Adrenals/Urinary Tract: Normal adrenals. No hydronephrosis. Stable subcentimeter hypodense anterior interpolar left renal cortical lesion, too small to characterize. No new renal cortical lesions. Two small left perinephric nodules measuring 1.0 cm in the interpolar region (series 2/image 68) and 1.0 cm inferiorly (series 2/image 82) are stable. Dominant right lower perinephric 1.8 cm nodule (series 2/image 81) is increased from 1.1 cm. Additional inferior right perinephric nodules measuring 1.0 cm (series 2/image 85) and 0.8 cm (series 2/image 83) are stable. Normal bladder.  Stomach/Bowel: Normal non-distended stomach. Normal caliber small bowel with no small bowel wall thickening. Normal appendix. Normal large bowel with no diverticulosis, large bowel wall thickening or pericolonic fat stranding.  Vascular/Lymphatic: Normal caliber abdominal aorta. Patent portal, splenic, hepatic and renal veins. No pathologically enlarged lymph nodes  in the abdomen or pelvis.  Reproductive: Mildly enlarged prostate with nonspecific internal prostatic calcifications.  Other: No pneumoperitoneum, ascites or focal fluid collection. Stable moderate fat containing supraumbilical ventral abdominal hernia to the left of midline.  Musculoskeletal: No aggressive appearing focal osseous lesions. Marked degenerative disc disease at L5-S1.  IMPRESSION: 1. Dominant  right lower perinephric 1.8 cm nodule has increased in size from 1.1 cm on 02/26/2018 PET-CT, cannot exclude enlarging right perinephric lymphoma. 2. Additional small bilateral lower perinephric nodules are stable. 3. No additional potential sites of new or enlarging lymphoma in the chest, abdomen or pelvis. Mild posterior mediastinal adenopathy is stable and was non FDG avid. 4. Minimal patchy peribronchovascular ground-glass opacity in the anterior right upper lobe, new, probably inflammatory, recommend attention on follow-up.      Impression and Plan:   57 year old woman with the following issues:  1.  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  He has documented perinephric lesions that has minimally changed over the this time.  CT scan obtained on 09/04/2018 was personally reviewed today and showed minimal changes at this time.  1 of his perinephric masses have slightly increased in size but he remains asymptomatic.  The natural course of this disease as well as treatment options were discussed.  These would include systemic therapy versus observation and surveillance.  Given the small nature of his perinephric masses I would recommend continued observation and surveillance at this time and repeat imaging studies in 6 months.  He is agreeable with this plan at this point.  Active treatment utilizing systemic chemotherapy may be needed if these nodules grow in size or start causing organ compromise.   2. Right upper lobe lung mass: CT scan of the chest on 09/04/2018 did not show any evidence of clear-cut malignancy.    3.  Elevated liver function test: Still mildly elevated at this time but not changed.  We will continue to monitor this moving forward.  4. Follow-up: Will be in 6 months for a repeat scan and a physical examination.  15  minutes was spent with the patient face-to-face today.  More than 50% of time was dedicated to reviewing the natural course of  his disease, treatment options as well as coordinating his plan of care.  Zola Button, MD 9/20/20198:11 AM

## 2019-03-04 ENCOUNTER — Other Ambulatory Visit: Payer: Self-pay

## 2019-03-04 DIAGNOSIS — N2889 Other specified disorders of kidney and ureter: Secondary | ICD-10-CM

## 2019-03-05 ENCOUNTER — Ambulatory Visit (HOSPITAL_COMMUNITY)
Admission: RE | Admit: 2019-03-05 | Discharge: 2019-03-05 | Disposition: A | Discharge status: Home / Self Care | Payer: BC Managed Care – PPO | Source: Ambulatory Visit | Attending: Oncology | Admitting: Oncology

## 2019-03-05 ENCOUNTER — Inpatient Hospital Stay: Payer: BC Managed Care – PPO | Attending: Oncology

## 2019-03-05 ENCOUNTER — Encounter (HOSPITAL_COMMUNITY): Payer: Self-pay

## 2019-03-05 ENCOUNTER — Other Ambulatory Visit: Payer: Self-pay

## 2019-03-05 DIAGNOSIS — R918 Other nonspecific abnormal finding of lung field: Secondary | ICD-10-CM | POA: Diagnosis not present

## 2019-03-05 DIAGNOSIS — Z8572 Personal history of non-Hodgkin lymphomas: Secondary | ICD-10-CM | POA: Insufficient documentation

## 2019-03-05 DIAGNOSIS — C8380 Other non-follicular lymphoma, unspecified site: Secondary | ICD-10-CM

## 2019-03-05 DIAGNOSIS — Z79899 Other long term (current) drug therapy: Secondary | ICD-10-CM | POA: Diagnosis not present

## 2019-03-05 DIAGNOSIS — R7989 Other specified abnormal findings of blood chemistry: Secondary | ICD-10-CM | POA: Insufficient documentation

## 2019-03-05 DIAGNOSIS — N2889 Other specified disorders of kidney and ureter: Secondary | ICD-10-CM

## 2019-03-05 LAB — CMP (CANCER CENTER ONLY)
ALT: 104 U/L — ABNORMAL HIGH (ref 0–44)
AST: 61 U/L — AB (ref 15–41)
Albumin: 4.2 g/dL (ref 3.5–5.0)
Alkaline Phosphatase: 95 U/L (ref 38–126)
Anion gap: 13 (ref 5–15)
BILIRUBIN TOTAL: 0.6 mg/dL (ref 0.3–1.2)
BUN: 13 mg/dL (ref 6–20)
CO2: 22 mmol/L (ref 22–32)
Calcium: 8.9 mg/dL (ref 8.9–10.3)
Chloride: 103 mmol/L (ref 98–111)
Creatinine: 0.9 mg/dL (ref 0.61–1.24)
Glucose, Bld: 143 mg/dL — ABNORMAL HIGH (ref 70–99)
Potassium: 4.3 mmol/L (ref 3.5–5.1)
Sodium: 138 mmol/L (ref 135–145)
TOTAL PROTEIN: 7.7 g/dL (ref 6.5–8.1)

## 2019-03-05 LAB — CBC WITH DIFFERENTIAL (CANCER CENTER ONLY)
ABS IMMATURE GRANULOCYTES: 0.05 10*3/uL (ref 0.00–0.07)
BASOS PCT: 1 %
Basophils Absolute: 0.1 10*3/uL (ref 0.0–0.1)
EOS ABS: 0.2 10*3/uL (ref 0.0–0.5)
Eosinophils Relative: 2 %
HEMATOCRIT: 45.5 % (ref 39.0–52.0)
Hemoglobin: 15.3 g/dL (ref 13.0–17.0)
IMMATURE GRANULOCYTES: 1 %
LYMPHS ABS: 1.6 10*3/uL (ref 0.7–4.0)
Lymphocytes Relative: 16 %
MCH: 28.6 pg (ref 26.0–34.0)
MCHC: 33.6 g/dL (ref 30.0–36.0)
MCV: 85 fL (ref 80.0–100.0)
MONO ABS: 0.9 10*3/uL (ref 0.1–1.0)
MONOS PCT: 9 %
NRBC: 0 % (ref 0.0–0.2)
Neutro Abs: 7.2 10*3/uL (ref 1.7–7.7)
Neutrophils Relative %: 71 %
Platelet Count: 229 10*3/uL (ref 150–400)
RBC: 5.35 MIL/uL (ref 4.22–5.81)
RDW: 12.4 % (ref 11.5–15.5)
WBC: 10 10*3/uL (ref 4.0–10.5)

## 2019-03-05 MED ORDER — SODIUM CHLORIDE (PF) 0.9 % IJ SOLN
INTRAMUSCULAR | Status: AC
  Filled 2019-03-05: qty 50

## 2019-03-05 MED ORDER — IOHEXOL 300 MG/ML  SOLN
100.0000 mL | Freq: Once | INTRAMUSCULAR | Status: AC | PRN
  Administered 2019-03-05: 100 mL via INTRAVENOUS

## 2019-03-06 ENCOUNTER — Telehealth: Payer: Self-pay

## 2019-03-06 NOTE — Telephone Encounter (Signed)
Contacted patient for screening prior to appointment. Patient has not traveled recently, no respiratory symptoms or fever, and has not been around anyone that meets the criteria either. He is aware that he will be screened upon arrival for appointment as well.

## 2019-03-07 ENCOUNTER — Other Ambulatory Visit: Payer: Self-pay

## 2019-03-07 ENCOUNTER — Telehealth: Payer: Self-pay | Admitting: Oncology

## 2019-03-07 ENCOUNTER — Inpatient Hospital Stay (HOSPITAL_BASED_OUTPATIENT_CLINIC_OR_DEPARTMENT_OTHER): Payer: BC Managed Care – PPO | Admitting: Oncology

## 2019-03-07 VITALS — BP 145/96 | HR 88 | Temp 97.8°F | Resp 18 | Ht 73.0 in | Wt 252.5 lb

## 2019-03-07 DIAGNOSIS — Z8572 Personal history of non-Hodgkin lymphomas: Secondary | ICD-10-CM | POA: Diagnosis not present

## 2019-03-07 DIAGNOSIS — R7989 Other specified abnormal findings of blood chemistry: Secondary | ICD-10-CM | POA: Diagnosis not present

## 2019-03-07 DIAGNOSIS — Z79899 Other long term (current) drug therapy: Secondary | ICD-10-CM | POA: Diagnosis not present

## 2019-03-07 DIAGNOSIS — R918 Other nonspecific abnormal finding of lung field: Secondary | ICD-10-CM | POA: Diagnosis not present

## 2019-03-07 DIAGNOSIS — C8380 Other non-follicular lymphoma, unspecified site: Secondary | ICD-10-CM

## 2019-03-07 NOTE — Telephone Encounter (Signed)
Gave avs, calendar, and contrast ° °

## 2019-03-07 NOTE — Progress Notes (Signed)
Hematology and Oncology Follow Up Visit  Nicholas Pope [id number] 12-15-1961 58 y.o. 03/07/2019 8:12 AM Nicholas Pope, MDBurdine, [name], MD   Principle Diagnosis: 58 year old man with:  1.  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  He presented with a small perinephric mass on the left side.   2.  Right upper lobe lung nodule discovered in July 2018.  Follow-up PET scan showed resolution of these findings indicating benign etiology.   Prior Therapy:  Status post needle biopsy done on 11/07/2016 that was not diagnostic.   He is S/P robotic-assisted laparoscopic excision of a left perinephric mass done by Dr. Alinda Pope on 01/08/2017. 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  Current therapy: Active surveillance.  Interim History: Nicholas Pope returns today for a repeat evaluation.  Since the last visit, he reports no major complaints at this time.  He denies any fevers or chills or weight loss.  His appetite remain excellent without any decline in ability to perform activities of daily living.  He denies any painful adenopathy or hospitalization.  His performance status and activity level remains unchanged.  Patient denied any alteration mental status, neuropathy, confusion or dizziness.  Denies any headaches or lethargy.  Denies any night sweats, weight loss or changes in appetite.  Denied orthopnea, dyspnea on exertion or chest discomfort.  Denies shortness of breath, difficulty breathing hemoptysis or cough.  Denies any abdominal distention, nausea, early satiety or dyspepsia.  Denies any hematuria, frequency, dysuria or nocturia.  Denies any skin irritation, dryness or rash.  Denies any ecchymosis or petechiae.  Denies any lymphadenopathy or clotting.  Denies any heat or cold intolerance.  Denies any anxiety or depression.  Remaining review of system is negative.     Medications: I have reviewed the patient's current medications.  Current  Outpatient Medications  Medication Sig Dispense Refill  . fluticasone (FLONASE) 50 MCG/ACT nasal spray Place 2 sprays into both nostrils daily.    . folic acid (FOLVITE) 622 MCG tablet Take 400 mcg by mouth at bedtime.     Marland Kitchen loratadine (CLARITIN) 10 MG tablet Take 10 mg by mouth daily.    . Multiple Vitamin (MULTIVITAMIN WITH MINERALS) TABS tablet Take 1 tablet by mouth at bedtime. Men's One-A-Day    . omeprazole (PRILOSEC) 40 MG capsule Take 40 mg by mouth at bedtime.      No current facility-administered medications for this visit.      Allergies: No Known Allergies  Past Medical History, Surgical history, Social history, and Family History were reviewed and updated.   Physical Exam:  Blood pressure (!) 145/96, pulse 88, temperature 97.8 F (36.6 C), temperature source Oral, resp. rate 18, height 6\' 1"  (1.854 m), weight 252 lb 8 oz (114.5 kg), SpO2 99 %.   ECOG: 0   General appearance: Alert, awake without any distress. Head: Atraumatic without abnormalities Oropharynx: Without any thrush or ulcers. Eyes: No scleral icterus. Lymph nodes: No lymphadenopathy noted in the cervical, supraclavicular, or axillary nodes Heart:regular rate and rhythm, without any murmurs or gallops.   Lung: Clear to auscultation without any rhonchi, wheezes or dullness to percussion. Abdomin: Soft, nontender without any shifting dullness or ascites. Musculoskeletal: No clubbing or cyanosis. Neurological: No motor or sensory deficits. Skin: No rashes or lesions.     Lab Results: Lab Results  Component Value Date   WBC 10.0 03/05/2019   HGB 15.3 03/05/2019   HCT 45.5 03/05/2019   MCV 85.0 03/05/2019  PLT 229 03/05/2019     Chemistry      Component Value Date/Time   NA 138 03/05/2019 0814   NA 139 10/26/2017 0741   K 4.3 03/05/2019 0814   K 4.3 10/26/2017 0741   CL 103 03/05/2019 0814   CO2 22 03/05/2019 0814   CO2 25 10/26/2017 0741   BUN 13 03/05/2019 0814   BUN 13.5 10/26/2017  0741   CREATININE 0.90 03/05/2019 0814   CREATININE 0.8 10/26/2017 0741      Component Value Date/Time   CALCIUM 8.9 03/05/2019 0814   CALCIUM 9.0 10/26/2017 0741   ALKPHOS 95 03/05/2019 0814   ALKPHOS 87 10/26/2017 0741   AST 61 (H) 03/05/2019 0814   AST 62 (H) 10/26/2017 0741   ALT 104 (H) 03/05/2019 0814   ALT 116 (H) 10/26/2017 0741   BILITOT 0.6 03/05/2019 0814   BILITOT 0.64 10/26/2017 0741     EXAM: CT CHEST, ABDOMEN, AND PELVIS WITH CONTRAST  TECHNIQUE: Multidetector CT imaging of the chest, abdomen and pelvis was performed following the standard protocol during bolus administration of intravenous contrast.  CONTRAST:  123mL OMNIPAQUE IOHEXOL 300 MG/ML  SOLN  COMPARISON:  09/04/2018  FINDINGS: CT CHEST FINDINGS  Cardiovascular: Heart is normal in size.  No pericardial effusion.  No evidence of thoracic aortic aneurysm.  Mediastinum/Nodes: Small mediastinal lymph nodes, measuring up to 9 mm in the subcarinal region.  No suspicious hilar or axillary lymphadenopathy.  Visualized thyroid is unremarkable.  Lungs/Pleura: Mild platelike scarring in the right upper lobe (series 7/image 45).  Dependent atelectasis in the bilateral lower lobes.  No suspicious pulmonary nodules. Calcified granuloma in the subpleural left lower lobe (series 7/image 98).  No pleural effusion or pneumothorax.  Musculoskeletal: Mild degenerative changes of the lower thoracic spine.  CT ABDOMEN PELVIS FINDINGS  Hepatobiliary: Liver is within normal limits.  Gallbladder is unremarkable. No intrahepatic or extrahepatic ductal dilatation.  Pancreas: Within normal limits.  Spleen: Within normal limits.  Adrenals/Urinary Tract: Adrenal glands are within normal limits.  Perinephric soft tissue along the lateral right lower kidney measures 1.4 x 1.7 cm (series 2/image 82), unchanged. Given the clinical history, this may reflect postsurgical changes,  although residual lymphoma is not excluded. Additional perinephric soft tissue nodules along the right lower kidney are unchanged.  Left kidney is within normal limits.  No hydronephrosis.  Bladder is underdistended but unremarkable.  Stomach/Bowel: Stomach is within normal limits.  No evidence of bowel obstruction.  Normal appendix (series 2/image 95).  Vascular/Lymphatic: No evidence of abdominal aortic aneurysm.  No suspicious abdominopelvic lymphadenopathy.  Reproductive: Prostate is notable for enlargement the central gland, indenting the base of the bladder (series 2/image 120).  Other: No abdominopelvic ascites.  Moderate fat containing midline supraumbilical ventral hernia (series 2/image 82).  Musculoskeletal: Mild degenerative changes at L5-S1.  IMPRESSION: Stable perinephric soft tissue along the lateral right lower kidney, unchanged. Given the clinical history, this may reflect postsurgical changes, residual lymphoma not excluded.  No suspicious lymphadenopathy in the chest, abdomen, or pelvis.   Impression and Plan:   58 year old woman with:  1.  Low-grade lymphoma in the form of marginal zone subtype diagnosed in October 2017.  He has left perinephric mass that was biopsy-proven.    He remains on active surveillance at this time without any evidence of clinical progression.  CT scan obtained on 03/05/2019 was personally reviewed and showed that his scan is unchanged.  He continues to have a 1.4 x 1.7 cm lateral  to the right kidney which is unchanged.  These findings could reflect a residual lymphoma versus core tissues.  The natural course of this disease was reviewed today and treatment options were discussed.  At this time I have recommended continued active surveillance without any intervention.  Indication for treatment would be symptomatic progression or bulky disease which he certainly does not have.  I recommend repeat imaging studies  in 6 months.   2. Right upper lobe lung mass: No evidence of progression or suspected malignancy.  This is due to scar tissue rather than evidence of metastasis.    3.  Elevated liver function test: Not related to his lymphoproliferative disorder.  His AST and ALT remains elevated at this time.  Appears to be related to fatty liver and he is making measures to lose weight and exercise more regularly.  4. Follow-up: I recommended repeat evaluation in 6 months with repeat imaging studies at that time.  15  minutes was spent with the patient face-to-face today.  More than 50% of time was spent on reviewing his disease status, treatment options and reviewing CT scan and laboratory results.  Zola Button, MD 3/20/20208:12 AM

## 2019-06-17 ENCOUNTER — Other Ambulatory Visit: Payer: Self-pay

## 2019-06-17 ENCOUNTER — Other Ambulatory Visit: Payer: BC Managed Care – PPO

## 2019-06-17 DIAGNOSIS — Z20822 Contact with and (suspected) exposure to covid-19: Secondary | ICD-10-CM

## 2019-06-21 LAB — NOVEL CORONAVIRUS, NAA: SARS-CoV-2, NAA: NOT DETECTED

## 2019-09-03 ENCOUNTER — Ambulatory Visit (HOSPITAL_COMMUNITY)
Admission: RE | Admit: 2019-09-03 | Discharge: 2019-09-03 | Disposition: A | Discharge status: Home / Self Care | Payer: BC Managed Care – PPO | Source: Ambulatory Visit | Attending: Oncology | Admitting: Oncology

## 2019-09-03 ENCOUNTER — Other Ambulatory Visit: Payer: Self-pay

## 2019-09-03 ENCOUNTER — Encounter (HOSPITAL_COMMUNITY): Payer: Self-pay

## 2019-09-03 ENCOUNTER — Inpatient Hospital Stay: Payer: BC Managed Care – PPO | Attending: Oncology

## 2019-09-03 DIAGNOSIS — R590 Localized enlarged lymph nodes: Secondary | ICD-10-CM | POA: Insufficient documentation

## 2019-09-03 DIAGNOSIS — R918 Other nonspecific abnormal finding of lung field: Secondary | ICD-10-CM | POA: Diagnosis not present

## 2019-09-03 DIAGNOSIS — Z79899 Other long term (current) drug therapy: Secondary | ICD-10-CM | POA: Insufficient documentation

## 2019-09-03 DIAGNOSIS — R7989 Other specified abnormal findings of blood chemistry: Secondary | ICD-10-CM | POA: Diagnosis not present

## 2019-09-03 DIAGNOSIS — C8380 Other non-follicular lymphoma, unspecified site: Secondary | ICD-10-CM | POA: Insufficient documentation

## 2019-09-03 DIAGNOSIS — R161 Splenomegaly, not elsewhere classified: Secondary | ICD-10-CM | POA: Diagnosis not present

## 2019-09-03 DIAGNOSIS — R911 Solitary pulmonary nodule: Secondary | ICD-10-CM | POA: Insufficient documentation

## 2019-09-03 DIAGNOSIS — C8593 Non-Hodgkin lymphoma, unspecified, intra-abdominal lymph nodes: Secondary | ICD-10-CM | POA: Insufficient documentation

## 2019-09-03 LAB — CBC WITH DIFFERENTIAL (CANCER CENTER ONLY)
Abs Immature Granulocytes: 0.01 10*3/uL (ref 0.00–0.07)
Basophils Absolute: 0.1 10*3/uL (ref 0.0–0.1)
Basophils Relative: 1 %
Eosinophils Absolute: 0.3 10*3/uL (ref 0.0–0.5)
Eosinophils Relative: 4 %
HCT: 43.4 % (ref 39.0–52.0)
Hemoglobin: 14.7 g/dL (ref 13.0–17.0)
Immature Granulocytes: 0 %
Lymphocytes Relative: 30 %
Lymphs Abs: 2.2 10*3/uL (ref 0.7–4.0)
MCH: 29 pg (ref 26.0–34.0)
MCHC: 33.9 g/dL (ref 30.0–36.0)
MCV: 85.6 fL (ref 80.0–100.0)
Monocytes Absolute: 0.8 10*3/uL (ref 0.1–1.0)
Monocytes Relative: 11 %
Neutro Abs: 4 10*3/uL (ref 1.7–7.7)
Neutrophils Relative %: 54 %
Platelet Count: 227 10*3/uL (ref 150–400)
RBC: 5.07 MIL/uL (ref 4.22–5.81)
RDW: 12.7 % (ref 11.5–15.5)
WBC Count: 7.3 10*3/uL (ref 4.0–10.5)
nRBC: 0 % (ref 0.0–0.2)

## 2019-09-03 LAB — CMP (CANCER CENTER ONLY)
ALT: 97 U/L — ABNORMAL HIGH (ref 0–44)
AST: 66 U/L — ABNORMAL HIGH (ref 15–41)
Albumin: 4.4 g/dL (ref 3.5–5.0)
Alkaline Phosphatase: 81 U/L (ref 38–126)
Anion gap: 11 (ref 5–15)
BUN: 13 mg/dL (ref 6–20)
CO2: 25 mmol/L (ref 22–32)
Calcium: 8.9 mg/dL (ref 8.9–10.3)
Chloride: 103 mmol/L (ref 98–111)
Creatinine: 0.83 mg/dL (ref 0.61–1.24)
GFR, Est AFR Am: >60 mL/min (ref >60)
GFR, Estimated: >60 mL/min (ref >60)
Glucose, Bld: 139 mg/dL — ABNORMAL HIGH (ref 70–99)
Potassium: 3.8 mmol/L (ref 3.5–5.1)
Sodium: 139 mmol/L (ref 135–145)
Total Bilirubin: 0.5 mg/dL (ref 0.3–1.2)
Total Protein: 7.5 g/dL (ref 6.5–8.1)

## 2019-09-03 MED ORDER — IOHEXOL 300 MG/ML  SOLN
100.0000 mL | Freq: Once | INTRAMUSCULAR | Status: AC | PRN
  Administered 2019-09-03: 100 mL via INTRAVENOUS

## 2019-09-03 MED ORDER — SODIUM CHLORIDE (PF) 0.9 % IJ SOLN
INTRAMUSCULAR | Status: AC
  Filled 2019-09-03: qty 50

## 2019-09-05 ENCOUNTER — Inpatient Hospital Stay (HOSPITAL_BASED_OUTPATIENT_CLINIC_OR_DEPARTMENT_OTHER): Payer: BC Managed Care – PPO | Admitting: Oncology

## 2019-09-05 ENCOUNTER — Other Ambulatory Visit: Payer: Self-pay

## 2019-09-05 VITALS — BP 148/84 | HR 83 | Temp 98.5°F | Resp 18 | Ht 73.0 in | Wt 248.0 lb

## 2019-09-05 DIAGNOSIS — C8380 Other non-follicular lymphoma, unspecified site: Secondary | ICD-10-CM | POA: Diagnosis not present

## 2019-09-05 DIAGNOSIS — C8593 Non-Hodgkin lymphoma, unspecified, intra-abdominal lymph nodes: Secondary | ICD-10-CM | POA: Diagnosis not present

## 2019-09-05 NOTE — Progress Notes (Signed)
Hematology and Oncology Follow Up Visit  Nicholas Pope [id number] [birth year] 58 y.o. 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  He final pathology showed marginal zone subtype..     Prior Therapy:  Status post needle biopsy done on 11/07/2016 that was not diagnostic.   He is S/P robotic-assisted laparoscopic excision of a left perinephric mass done by Dr. Alinda Money on 01/08/2017. 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  Current therapy: Active surveillance.  Interim History: [name] is here for a follow-up visit.  Since the last visit, he reports no major changes in his health.  He continues to feel well.  He report no nausea vomiting.  He denies any flank pain or discomfort.  His performance status and quality of life remains excellent.  Continues to work full-time without any difficulties.  He is intentionally trying to lose weight at this time.  He denied headaches, blurry vision, syncope or seizures.  Denies any fevers, chills or sweats.  Denied chest pain, palpitation, orthopnea or leg edema.  Denied cough, wheezing or hemoptysis.  Denied nausea, vomiting or abdominal pain.  Denies any constipation or diarrhea.  Denies any frequency urgency or hesitancy.  Denies any arthralgias or myalgias.  Denies any skin rashes or lesions.  Denies any bleeding or clotting tendency.  Denies any easy bruising.  Denies any hair or nail changes.  Denies any anxiety or depression.  Remaining review of system is negative.      Medications: Updated on review. Current Outpatient Medications  Medication Sig Dispense Refill  . fluticasone (FLONASE) 50 MCG/ACT nasal spray Place 2 sprays into both nostrils daily.    . folic acid (FOLVITE) A999333 MCG tablet Take 400 mcg by mouth at bedtime.     Marland Kitchen loratadine (CLARITIN) 10 MG tablet  Take 10 mg by mouth daily.    . Multiple Vitamin (MULTIVITAMIN WITH MINERALS) TABS tablet Take 1 tablet by mouth at bedtime. Men's One-A-Day    . omeprazole (PRILOSEC) 40 MG capsule Take 40 mg by mouth at bedtime.      No current facility-administered medications for this visit.      Allergies: No Known Allergies  Past Medical History, Surgical history, Social history, and Family History updated without any changes.   Physical Exam:  Blood pressure (!) 148/84, pulse 83, temperature 98.5 F (36.9 C), temperature source Oral, resp. rate 18, height 6\' 1"  (1.854 m), weight 248 lb (112.5 kg), SpO2 99 %.    ECOG: 0    General appearance: Comfortable appearing without any discomfort Head: Normocephalic without any trauma Oropharynx: Mucous membranes are moist and pink without any thrush or ulcers. Eyes: Pupils are equal and round reactive to light. Lymph nodes: No cervical, supraclavicular, inguinal or axillary lymphadenopathy.   Heart:regular rate and rhythm.  S1 and S2 without leg edema. Lung: Clear without any rhonchi or wheezes.  No dullness to percussion. Abdomin: Soft, nontender, nondistended with good bowel sounds.  No hepatosplenomegaly. Musculoskeletal: No joint deformity or effusion.  Full range of motion noted. Neurological: No deficits noted on motor, sensory and deep tendon reflex exam. Skin: No petechial rash or dryness.  Appeared moist.       Lab Results: Lab Results  Component Value Date   WBC 7.3 09/03/2019   HGB 14.7 09/03/2019   HCT 43.4 09/03/2019   MCV 85.6 09/03/2019  PLT 227 09/03/2019     Chemistry      Component Value Date/Time   NA 139 09/03/2019 0740   NA 139 10/26/2017 0741   K 3.8 09/03/2019 0740   K 4.3 10/26/2017 0741   CL 103 09/03/2019 0740   CO2 25 09/03/2019 0740   CO2 25 10/26/2017 0741   BUN 13 09/03/2019 0740   BUN 13.5 10/26/2017 0741   CREATININE 0.83 09/03/2019 0740   CREATININE 0.8 10/26/2017 0741      Component Value  Date/Time   CALCIUM 8.9 09/03/2019 0740   CALCIUM 9.0 10/26/2017 0741   ALKPHOS 81 09/03/2019 0740   ALKPHOS 87 10/26/2017 0741   AST 66 (H) 09/03/2019 0740   AST 62 (H) 10/26/2017 0741   ALT 97 (H) 09/03/2019 0740   ALT 116 (H) 10/26/2017 0741   BILITOT 0.5 09/03/2019 0740   BILITOT 0.64 10/26/2017 0741      EXAM: CT CHEST, ABDOMEN, AND PELVIS WITH CONTRAST  TECHNIQUE: Multidetector CT imaging of the chest, abdomen and pelvis was performed following the standard protocol during bolus administration of intravenous contrast.  CONTRAST:  160mL OMNIPAQUE IOHEXOL 300 MG/ML SOLN, additional oral enteric contrast  COMPARISON:  CT chest abdomen pelvis, 03/05/2019, 09/04/2018, PET-CT, 02/26/2018, 12/05/2016  FINDINGS: CT CHEST FINDINGS  Cardiovascular: No significant vascular findings. Normal heart size. No pericardial effusion.  Mediastinum/Nodes: Redemonstrated prominent axillary and mediastinal lymph nodes, the largest right paratracheal/superior mediastinal lymph nodes measuring 0.9 cm, unchanged from prior (series 2, image 20). Thyroid gland, trachea, and esophagus demonstrate no significant findings.  Lungs/Pleura: Stable, benign 1.0 cm pulmonary nodule of the right lower lobe (series 6, image 92). Minimal bibasilar scarring and/or partial atelectasis. No pleural effusion or pneumothorax.  Musculoskeletal: No chest wall mass or suspicious bone lesions identified.  CT ABDOMEN PELVIS FINDINGS  Hepatobiliary: No solid liver abnormality is seen. Hepatic steatosis. No gallstones, gallbladder wall thickening, or biliary dilatation.  Pancreas: Unremarkable. No pancreatic ductal dilatation or surrounding inflammatory changes.  Spleen: Unchanged splenomegaly, maximum span 15.2 cm.  Adrenals/Urinary Tract: Adrenal glands are unremarkable. Kidneys are normal, without renal calculi, solid lesion, or hydronephrosis. Bladder is unremarkable.  Stomach/Bowel:  Stomach is within normal limits. Appendix appears normal. No evidence of bowel wall thickening, distention, or inflammatory changes.  Vascular/Lymphatic: No significant vascular findings are present. Numerous prominent retroperitoneal, iliac, pelvic sidewall, and inguinal lymph nodes are unchanged compared to prior examination, an index periaortic node measuring 0.6 cm (series 2, image 78).  Reproductive: Prostatomegaly.  Other: Redemonstrated fat containing midline ventral epigastric hernia. There are multiple small soft tissue nodules within the retroperitoneal fat, the largest about the inferior pole of the right kidney measuring 2.0 x 1.8 cm, increased in size over sequential prior examinations, previously 1.5 x 1.4 cm when measured similarly (series 2, image 81). Additional smaller nodules are not definitively changed in size on comparison to immediate prior examination although appear to be very gradually increasing in size over time (e.g. a 5 mm nodule in the left paracolic gutter (series 2, image 96). No abdominopelvic ascites.  Musculoskeletal: No acute or significant osseous findings.  IMPRESSION: 1. Redemonstrated prominent axillary and mediastinal lymph nodes, the largest right paratracheal/superior mediastinal lymph nodes measuring 0.9 cm, unchanged from prior (series 2, image 20). Numerous prominent retroperitoneal, iliac, pelvic sidewall, and inguinal lymph nodes are unchanged compared to prior examination, an index periaortic node measuring 0.6 cm (series 2, image 78).  2. There are multiple small soft tissue nodules within the retroperitoneal fat,  the largest about the inferior pole of the right kidney measuring 2.0 x 1.8 cm, increased in size over sequential prior examinations, previously 1.5 x 1.4 cm when measured similarly (series 2, image 81). Additional smaller nodules are not definitively changed in size on comparison to immediate prior examination  although appear to be very gradually increasing in size over time (e.g. a 5 mm nodule in the left paracolic gutter (series 2, image 96). Findings are concerning for indolent, residual lymphoma given long-term trend.  3.  Unchanged splenomegaly.  4.  Stable, benign pulmonary nodule of the right lower lobe.  5.  Other chronic and incidental findings as detailed above.   Electronically Signed   By: Eddie Candle M.D.   On: 09/03/2019 12:14   Impression and Plan:   58 year old woman with:  1.  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  Presented with a perinephric mass that has been resected.  He has been on active surveillance without any indication for treatment.  CT scan obtained on 09/02/2019 was personally reviewed and showed no major progression noted at this time.  His CT scan has been relatively stable with small enlargement of a right perinephric fat.  At this time, I have recommended continued active surveillance without any need for intervention or treatment.  The natural course of this disease and indication for treatment were reviewed.  These indications would include rapid growth of these tumors, organ dysfunction among others.  For the time being I recommended active surveillance.  We will repeat imaging studies in 6 months   2. Right upper lobe lung mass: Benign etiology and unchanged on imaging studies.    3.  Elevated liver function test: Improved at this time as he is losing weight.  4. Follow-up: In 6 months for repeat evaluation.  15  minutes was spent with the patient face-to-face today.  More than 50% of time was dedicated to updating his disease status, reviewing imaging studies and answering questions regarding future plan of care.  Zola Button, MD 9/18/20203:45 PM

## 2019-09-08 ENCOUNTER — Telehealth: Payer: Self-pay | Admitting: Oncology

## 2019-09-08 ENCOUNTER — Encounter (INDEPENDENT_AMBULATORY_CARE_PROVIDER_SITE_OTHER): Payer: Self-pay | Admitting: *Deleted

## 2019-09-08 NOTE — Telephone Encounter (Signed)
No los nor sch message per 9/18. °

## 2019-09-10 ENCOUNTER — Ambulatory Visit: Payer: BC Managed Care – PPO | Admitting: Oncology

## 2019-10-07 ENCOUNTER — Other Ambulatory Visit: Payer: Self-pay

## 2019-10-07 DIAGNOSIS — Z20822 Contact with and (suspected) exposure to covid-19: Secondary | ICD-10-CM

## 2019-10-08 LAB — NOVEL CORONAVIRUS, NAA: SARS-CoV-2, NAA: NOT DETECTED

## 2020-02-10 ENCOUNTER — Telehealth: Payer: Self-pay | Admitting: Oncology

## 2020-02-10 NOTE — Telephone Encounter (Signed)
Scheduled appt per 2/22 sch message - unable to reach pt left message with appt date and time

## 2020-02-22 ENCOUNTER — Ambulatory Visit: Payer: BC Managed Care – PPO | Attending: Internal Medicine

## 2020-02-22 DIAGNOSIS — Z23 Encounter for immunization: Secondary | ICD-10-CM | POA: Insufficient documentation

## 2020-02-22 NOTE — Progress Notes (Signed)
   Covid-19 Vaccination Clinic  Name:  Nicholas Pope    MRN: [id number] DOB: 1961/06/23  02/22/2020  [name] was observed post Covid-19 immunization for 15 minutes without incident. He was provided with Vaccine Information Sheet and instruction to access the V-Safe system.   [name] was instructed to call 911 with any severe reactions post vaccine: Marland Kitchen Difficulty breathing  . Swelling of face and throat  . A fast heartbeat  . A bad rash all over body  . Dizziness and weakness   Immunizations Administered    Name Date Dose VIS Date Route   Pfizer COVID-19 Vaccine 02/22/2020  4:16 PM 0.3 mL 11/28/2019 Intramuscular   Manufacturer: [place]   Lot: [id number]   [place]: [id number]

## 2020-02-24 ENCOUNTER — Telehealth: Payer: Self-pay | Admitting: Oncology

## 2020-02-24 NOTE — Telephone Encounter (Signed)
I talk with patient regarding schedule  

## 2020-03-05 ENCOUNTER — Inpatient Hospital Stay: Payer: BC Managed Care – PPO | Attending: Oncology

## 2020-03-05 ENCOUNTER — Other Ambulatory Visit: Payer: Self-pay

## 2020-03-05 ENCOUNTER — Encounter (HOSPITAL_COMMUNITY): Payer: Self-pay

## 2020-03-05 ENCOUNTER — Ambulatory Visit (HOSPITAL_COMMUNITY)
Admission: RE | Admit: 2020-03-05 | Discharge: 2020-03-05 | Disposition: A | Discharge status: Home / Self Care | Payer: BC Managed Care – PPO | Source: Ambulatory Visit | Attending: Oncology | Admitting: Oncology

## 2020-03-05 DIAGNOSIS — I700 Atherosclerosis of aorta: Secondary | ICD-10-CM | POA: Diagnosis not present

## 2020-03-05 DIAGNOSIS — C8380 Other non-follicular lymphoma, unspecified site: Secondary | ICD-10-CM | POA: Insufficient documentation

## 2020-03-05 DIAGNOSIS — R7989 Other specified abnormal findings of blood chemistry: Secondary | ICD-10-CM | POA: Diagnosis not present

## 2020-03-05 DIAGNOSIS — R918 Other nonspecific abnormal finding of lung field: Secondary | ICD-10-CM | POA: Insufficient documentation

## 2020-03-05 DIAGNOSIS — C8583 Other specified types of non-Hodgkin lymphoma, intra-abdominal lymph nodes: Secondary | ICD-10-CM | POA: Insufficient documentation

## 2020-03-05 DIAGNOSIS — Z79899 Other long term (current) drug therapy: Secondary | ICD-10-CM | POA: Diagnosis not present

## 2020-03-05 DIAGNOSIS — K760 Fatty (change of) liver, not elsewhere classified: Secondary | ICD-10-CM | POA: Diagnosis not present

## 2020-03-05 LAB — CMP (CANCER CENTER ONLY)
ALT: 104 U/L — ABNORMAL HIGH (ref 0–44)
AST: 60 U/L — ABNORMAL HIGH (ref 15–41)
Albumin: 4.3 g/dL (ref 3.5–5.0)
Alkaline Phosphatase: 85 U/L (ref 38–126)
Anion gap: 10 (ref 5–15)
BUN: 16 mg/dL (ref 6–20)
CO2: 25 mmol/L (ref 22–32)
Calcium: 9.3 mg/dL (ref 8.9–10.3)
Chloride: 102 mmol/L (ref 98–111)
Creatinine: 0.88 mg/dL (ref 0.61–1.24)
GFR, Est AFR Am: >60 mL/min (ref >60)
GFR, Estimated: >60 mL/min (ref >60)
Glucose, Bld: 163 mg/dL — ABNORMAL HIGH (ref 70–99)
Potassium: 4.4 mmol/L (ref 3.5–5.1)
Sodium: 137 mmol/L (ref 135–145)
Total Bilirubin: 0.5 mg/dL (ref 0.3–1.2)
Total Protein: 8 g/dL (ref 6.5–8.1)

## 2020-03-05 LAB — CBC WITH DIFFERENTIAL (CANCER CENTER ONLY)
Abs Immature Granulocytes: 0.03 10*3/uL (ref 0.00–0.07)
Basophils Absolute: 0.1 10*3/uL (ref 0.0–0.1)
Basophils Relative: 1 %
Eosinophils Absolute: 0.2 10*3/uL (ref 0.0–0.5)
Eosinophils Relative: 3 %
HCT: 45.8 % (ref 39.0–52.0)
Hemoglobin: 15.5 g/dL (ref 13.0–17.0)
Immature Granulocytes: 1 %
Lymphocytes Relative: 30 %
Lymphs Abs: 1.9 10*3/uL (ref 0.7–4.0)
MCH: 28.7 pg (ref 26.0–34.0)
MCHC: 33.8 g/dL (ref 30.0–36.0)
MCV: 84.8 fL (ref 80.0–100.0)
Monocytes Absolute: 0.6 10*3/uL (ref 0.1–1.0)
Monocytes Relative: 9 %
Neutro Abs: 3.4 10*3/uL (ref 1.7–7.7)
Neutrophils Relative %: 56 %
Platelet Count: 237 10*3/uL (ref 150–400)
RBC: 5.4 MIL/uL (ref 4.22–5.81)
RDW: 12.2 % (ref 11.5–15.5)
WBC Count: 6.1 10*3/uL (ref 4.0–10.5)
nRBC: 0 % (ref 0.0–0.2)

## 2020-03-05 MED ORDER — SODIUM CHLORIDE (PF) 0.9 % IJ SOLN
INTRAMUSCULAR | Status: AC
  Filled 2020-03-05: qty 50

## 2020-03-05 MED ORDER — IOHEXOL 300 MG/ML  SOLN
100.0000 mL | Freq: Once | INTRAMUSCULAR | Status: AC | PRN
  Administered 2020-03-05: 100 mL via INTRAVENOUS

## 2020-03-09 ENCOUNTER — Inpatient Hospital Stay (HOSPITAL_BASED_OUTPATIENT_CLINIC_OR_DEPARTMENT_OTHER): Payer: BC Managed Care – PPO | Admitting: Oncology

## 2020-03-09 ENCOUNTER — Other Ambulatory Visit: Payer: Self-pay

## 2020-03-09 VITALS — BP 160/110 | HR 74 | Temp 97.9°F | Resp 18 | Ht 73.0 in | Wt 250.3 lb

## 2020-03-09 DIAGNOSIS — C8380 Other non-follicular lymphoma, unspecified site: Secondary | ICD-10-CM

## 2020-03-09 DIAGNOSIS — C8583 Other specified types of non-Hodgkin lymphoma, intra-abdominal lymph nodes: Secondary | ICD-10-CM | POA: Diagnosis not present

## 2020-03-09 NOTE — Progress Notes (Signed)
Hematology and Oncology Follow Up Visit  PILOT ENGLES [id number] 1961/02/27 59 y.o. 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  He presented with with a small perinephric mass and has not received any additional treatment.    Prior Therapy:  Status post needle biopsy done on 11/07/2016 that was not diagnostic.   He is S/P robotic-assisted laparoscopic excision of a left perinephric mass done by Dr. Alinda Money on 01/08/2017. 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  Current therapy: Active surveillance.  Interim History: Mr. Chess returns today for a repeat evaluation.  Since the last visit, he reports no major changes in his health.  He continues to enjoy excellent quality of life and performance status.  He denies any recent nausea, vomiting or abdominal pain.  He denies any fevers, chills or weight loss.  He denies any painful adenopathy or decline in his energy.      Medications: Updated on review. Current Outpatient Medications  Medication Sig Dispense Refill  . fluticasone (FLONASE) 50 MCG/ACT nasal spray Place 2 sprays into both nostrils daily.    . folic acid (FOLVITE) A999333 MCG tablet Take 400 mcg by mouth at bedtime.     Marland Kitchen loratadine (CLARITIN) 10 MG tablet Take 10 mg by mouth daily.    . Multiple Vitamin (MULTIVITAMIN WITH MINERALS) TABS tablet Take 1 tablet by mouth at bedtime. Men's One-A-Day    . omeprazole (PRILOSEC) 40 MG capsule Take 40 mg by mouth at bedtime.      No current facility-administered medications for this visit.     Allergies: No Known Allergies     Physical Exam:  Blood pressure (!) 160/110, pulse 74, temperature 97.9 F (36.6 C), temperature source Temporal, resp. rate 18, height 6\' 1"  (1.854 m), weight 250 lb 4.8 oz (113.5 kg), SpO2 98 %.     ECOG: 0     General appearance: Alert, awake without any  distress. Head: Atraumatic without abnormalities Oropharynx: Without any thrush or ulcers. Eyes: No scleral icterus. Lymph nodes: No lymphadenopathy noted in the cervical, supraclavicular, or axillary nodes Heart:regular rate and rhythm, without any murmurs or gallops.   Lung: Clear to auscultation without any rhonchi, wheezes or dullness to percussion. Abdomin: Soft, nontender without any shifting dullness or ascites. Musculoskeletal: No clubbing or cyanosis. Neurological: No motor or sensory deficits. Skin: No rashes or lesions.        Lab Results: Lab Results  Component Value Date   WBC 6.1 03/05/2020   HGB 15.5 03/05/2020   HCT 45.8 03/05/2020   MCV 84.8 03/05/2020   PLT 237 03/05/2020     Chemistry      Component Value Date/Time   NA 137 03/05/2020 0756   NA 139 10/26/2017 0741   K 4.4 03/05/2020 0756   K 4.3 10/26/2017 0741   CL 102 03/05/2020 0756   CO2 25 03/05/2020 0756   CO2 25 10/26/2017 0741   BUN 16 03/05/2020 0756   BUN 13.5 10/26/2017 0741   CREATININE 0.88 03/05/2020 0756   CREATININE 0.8 10/26/2017 0741      Component Value Date/Time   CALCIUM 9.3 03/05/2020 0756   CALCIUM 9.0 10/26/2017 0741   ALKPHOS 85 03/05/2020 0756   ALKPHOS 87 10/26/2017 0741   AST 60 (H) 03/05/2020 0756   AST 62 (H) 10/26/2017 0741   ALT 104 (H) 03/05/2020 0756   ALT 116 (H) 10/26/2017  0741   BILITOT 0.5 03/05/2020 0756   BILITOT 0.64 10/26/2017 0741     IMPRESSION: 1. Continued further progression of the ill-defined soft tissue nodule in the right retroperitoneal space adjacent to the lower pole right kidney, now measuring 2.7 x 2.3 cm. Other scattered retroperitoneal soft tissue nodules in the pelvis are stable in the interval. 2. Upper normal mediastinal lymph nodes and borderline increased axillary lymph nodes are similar to prior. 3. Stable 10 mm parahilar right lower lobe pulmonary nodule. 4.  Aortic Atherosclerois (ICD10-170.0)    Impression and  Plan:   59 year old woman with:  1.  Low-grade lymphoma diagnosed in 2017.  He was found to have marginal zone subtype with perinephric nodules.  Imaging studies obtained on March 05, 2020 were personally reviewed and discussed with the patient.  He does have increase in the size of the lower pole of the right kidney indicating a perinephric perforation measuring 2.7 x 2.3 cm.  Overall the CT scan is stable with upper normal mediastinal lymph node that are borderline increased.  Treatment options were reviewed at this time which include continued active surveillance, trial of systemic therapy versus closed treatment for the enlarging mass with radiation.  After discussion today, we have opted to proceed with radiation therapy for localized disease and defer systemic therapy to a later date unless he develops more widespread disease.   2. Right upper lobe lung mass: Does not appear to be malignant in nature.    3.  Elevated liver function test: Related to fatty liver without any evidence of malignancy based on imaging studies.  4. Follow-up: In 6 months for repeat evaluation.  30  minutes were dedicated to this visit.  The time was spent on reviewing imaging studies, reviewing laboratory data, discussing treatment options and future plan of care.  Zola Button, MD 3/23/20219:59 AM

## 2020-03-10 ENCOUNTER — Telehealth: Payer: Self-pay | Admitting: Oncology

## 2020-03-10 ENCOUNTER — Encounter: Payer: Self-pay | Admitting: Oncology

## 2020-03-10 NOTE — Telephone Encounter (Signed)
Scheduled appt per 3/23 los.  Sent a message to HIM pool to get a calendar mailed out.

## 2020-03-11 NOTE — Progress Notes (Signed)
Lymphoma Location(s) / Histology:  01/08/2017 Diagnosis Soft tissue mass, simple excision, left perinephric - NON-HODGKIN'S B-CELL LYMPHOMA.  Nicholas Pope presented with symptoms of: He presented in 2017 with elevated liver function tests and was found to have left perirenal masses incidentally on [place]. He has been followed by Dr. Alen Blew since that time and had a CT abdomen/chest on 03/05/20  Biopsies of left perinephric soft tissue mass revealed: non-hodgkin's B-cell lymphoma.   Past/Anticipated interventions by medical oncology, if any:  03/09/20 Dr. Alen Blew: Impression and Plan: 1.  Low-grade lymphoma diagnosed in 2017.  He was found to have marginal zone subtype with perinephric nodules.  Imaging studies obtained on March 05, 2020 were personally reviewed and discussed with the patient.  He does have increase in the size of the lower pole of the right kidney indicating a perinephric perforation measuring 2.7 x 2.3 cm.  Overall the CT scan is stable with upper normal mediastinal lymph node that are borderline increased.  Treatment options were reviewed at this time which include continued active surveillance, trial of systemic therapy versus closed treatment for the enlarging mass with radiation.  After discussion today, we have opted to proceed with radiation therapy for localized disease and defer systemic therapy to a later date unless he develops more widespread disease.  Weight changes, if any, over the past 6 months: He denies.  Recurrent fevers, or drenching night sweats, if any: He denies.  SAFETY ISSUES:  Prior radiation? No  Pacemaker/ICD? No  Possible current pregnancy? No  Is the patient on methotrexate? No  Current Complaints / other details:

## 2020-03-14 ENCOUNTER — Other Ambulatory Visit: Payer: Self-pay

## 2020-03-14 ENCOUNTER — Ambulatory Visit: Payer: BC Managed Care – PPO | Attending: Internal Medicine

## 2020-03-14 DIAGNOSIS — Z23 Encounter for immunization: Secondary | ICD-10-CM

## 2020-03-14 NOTE — Progress Notes (Signed)
   Covid-19 Vaccination Clinic  Name:  Nicholas Pope    MRN: [id number] DOB: August 22, 1961  03/14/2020  Nicholas Pope was observed post Covid-19 immunization for 15 minutes without incident. He was provided with Vaccine Information Sheet and instruction to access the V-Safe system.   Nicholas Pope was instructed to call 911 with any severe reactions post vaccine: Marland Kitchen Difficulty breathing  . Swelling of face and throat  . A fast heartbeat  . A bad rash all over body  . Dizziness and weakness   Immunizations Administered    Name Date Dose VIS Date Route   Pfizer COVID-19 Vaccine 03/14/2020  2:25 PM 0.3 mL 11/28/2019 Intramuscular   Manufacturer: [place]   Lot: [id number]   [place]: [id number]

## 2020-03-15 NOTE — Progress Notes (Signed)
Radiation Oncology         [phone] ________________________________  Initial outpatient Consultation - patient opted for telemedicine, MyChart video, which was offered to reduce risk of COVID transmission during the pandemic.  Name: Nicholas Pope MRN: [id number]  Date: 03/16/2020  DOB: September 19, 1961  GE:ZMOQHUT, [name], MD  Wyatt Portela, MD   REFERRING PHYSICIAN: Wyatt Portela, MD  DIAGNOSIS:    ICD-10-CM   1. 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 male who initially underwent work up for elevated liver enzymes in 09/2016. Abdomen MRI performed on 10/16/16 showed a 2.3 cm enhancing perirenal soft tissue lesion worrisome for lymphoma. He was referred to Dr. Alinda Money in urology and underwent biopsy on 11/07/16. Within the limits of insufficient material, pathology showed atypical lymphoid proliferation most suspicious for marginal zone type non-Hodgkin's lymphoma and no definite monoclonality by flow cytometry. PET scan in 11/2016 showed no widespread disease.  He proceeded to laparoscopic excisional biopsy of the left perinephric mass on 01/08/17 under Dr. Alinda Money. 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  He has been on active surveillance under Dr. Alen Blew since that time and has remained asymptomatic. His most recent restaging CT chest/abdomen/pelvis performed on 03/05/2020 showed progression of an ill-defined soft tissue nodule in the right retroperitoneal space, which grew from 1.8 cm in 08/2019 to 2.7 cm on recent study. The remainder of the exam was stable.   He met with Dr. Alen Blew on 03/09/2020 to discuss treatment options. They opted to pursue localized disease treatment and defer systemic therapy unless he develops more widespread disease.  He  works for the [employer].  He performs maintenance on multiple school campuses.  He and his wife engage in gardening at home.  He would like to receive his radiation therapy in [place].  He does not report any transportation difficulties.   PREVIOUS RADIATION THERAPY: No  PAST MEDICAL HISTORY:  has a past medical history of GERD (gastroesophageal reflux disease), History of bronchitis as a child, Pneumonia, and PONV (postoperative nausea and vomiting).    PAST SURGICAL HISTORY: Past Surgical History:  Procedure Laterality Date  . KNEE ARTHROPLASTY Left 1980's   torn cartilage   . ROBOTIC ASSITED PARTIAL NEPHRECTOMY Left 01/08/2017   Procedure: XI ROBOTIC ASSITED RESECTION OF PERINEPHRIC MASS;  Surgeon: Raynelle Bring, MD;  Location: [facility];  Service: Urology;  Laterality: Left;  . UMBILICAL HERNIA REPAIR     as a child    FAMILY HISTORY: family history is not on file.  SOCIAL HISTORY:  reports that he has never smoked. He quit smokeless tobacco use about 3 years ago.  His smokeless tobacco use included chew. He reports that he does not drink alcohol or use drugs. Quit chewing tobacco in early 2018.  ALLERGIES: Niacin and related  MEDICATIONS:  Current Outpatient Medications  Medication Sig Dispense Refill  . aspirin 325 MG tablet Take 325 mg by mouth daily.    . fluticasone (FLONASE) 50 MCG/ACT nasal spray Place 2 sprays into both nostrils daily.    . folic acid (FOLVITE) 654 MCG tablet Take 400 mcg by mouth at bedtime.     Marland Kitchen loratadine (CLARITIN) 10 MG tablet Take 10 mg by mouth daily.    . metFORMIN (GLUCOPHAGE) 500 MG tablet     .  Multiple Vitamin (MULTIVITAMIN WITH MINERALS) TABS tablet Take 1 tablet by mouth at bedtime. Men's One-A-Day    . omeprazole (PRILOSEC) 40 MG capsule Take 40 mg by mouth at bedtime.      No current facility-administered medications for this encounter.    REVIEW OF SYSTEMS:  Notable for that above.   PHYSICAL EXAM:  vitals  were not taken for this visit.   He is in no acute distress.  Alert and oriented.  Pleasant to speak with.  ECOG = 0  0 - Asymptomatic (Fully active, able to carry on all predisease activities without restriction)  1 - Symptomatic but completely ambulatory (Restricted in physically strenuous activity but ambulatory and able to carry out work of a light or sedentary nature. For example, light housework, office work)  2 - Symptomatic, <50% in bed during the day (Ambulatory and capable of all self care but unable to carry out any work activities. Up and about more than 50% of waking hours)  3 - Symptomatic, >50% in bed, but not bedbound (Capable of only limited self-care, confined to bed or chair 50% or more of waking hours)  4 - Bedbound (Completely disabled. Cannot carry on any self-care. Totally confined to bed or chair)  5 - Death   Eustace Pen MM, Creech RH, Tormey DC, et al. [phone]). "Toxicity and response criteria of the [facility]". [place] Oncol. 5 (6): 649-55   LABORATORY DATA:  Lab Results  Component Value Date   WBC 6.1 03/05/2020   HGB 15.5 03/05/2020   HCT 45.8 03/05/2020   MCV 84.8 03/05/2020   PLT 237 03/05/2020   CMP     Component Value Date/Time   NA 137 03/05/2020 0756   NA 139 10/26/2017 0741   K 4.4 03/05/2020 0756   K 4.3 10/26/2017 0741   CL 102 03/05/2020 0756   CO2 25 03/05/2020 0756   CO2 25 10/26/2017 0741   GLUCOSE 163 (H) 03/05/2020 0756   GLUCOSE 146 (H) 10/26/2017 0741   BUN 16 03/05/2020 0756   BUN 13.5 10/26/2017 0741   CREATININE 0.88 03/05/2020 0756   CREATININE 0.8 10/26/2017 0741   CALCIUM 9.3 03/05/2020 0756   CALCIUM 9.0 10/26/2017 0741   PROT 8.0 03/05/2020 0756   PROT 7.2 10/26/2017 0741   ALBUMIN 4.3 03/05/2020 0756   ALBUMIN 4.0 10/26/2017 0741   AST 60 (H) 03/05/2020 0756   AST 62 (H) 10/26/2017 0741   ALT 104 (H) 03/05/2020 0756   ALT 116 (H) 10/26/2017 0741   ALKPHOS 85 03/05/2020 0756   ALKPHOS 87  10/26/2017 0741   BILITOT 0.5 03/05/2020 0756   BILITOT 0.64 10/26/2017 0741   GFRNONAA >60 03/05/2020 0756   GFRAA >60 03/05/2020 0756         RADIOGRAPHY: CT Chest W Contrast  Result Date: 03/05/2020 CLINICAL DATA:  Non-Hodgkin's lymphoma. EXAM: CT CHEST, ABDOMEN, AND PELVIS WITH CONTRAST TECHNIQUE: Multidetector CT imaging of the chest, abdomen and pelvis was performed following the standard protocol during bolus administration of intravenous contrast. CONTRAST:  146m OMNIPAQUE IOHEXOL 300 MG/ML  SOLN COMPARISON:  09/03/2019 FINDINGS: CT CHEST FINDINGS Cardiovascular: The heart size is normal. No substantial pericardial effusion. No thoracic aortic aneurysm. Mediastinum/Nodes: No mediastinal lymphadenopathy with mediastinal nodes measuring up to maximum of about 9 mm short axis. There is no hilar lymphadenopathy. The esophagus has normal imaging features. Prominent axillary nodes again noted bilaterally, stable since prior including 15 mm left axillary node on 15/2. Lungs/Pleura:  The 10 mm parahilar right lower lobe nodule (95/4) is stable. Calcified left lower lobe peripheral granuloma again noted. No focal airspace consolidation. No pleural effusion. Musculoskeletal: No worrisome lytic or sclerotic osseous abnormality. CT ABDOMEN PELVIS FINDINGS Hepatobiliary: No suspicious focal abnormality within the liver parenchyma. There is no evidence for gallstones, gallbladder wall thickening, or pericholecystic fluid. No intrahepatic or extrahepatic biliary dilation. Pancreas: No focal mass lesion. No dilatation of the main duct. No intraparenchymal cyst. No peripancreatic edema. Spleen: Upper normal craniocaudal splenic length. No focal mass lesion. Adrenals/Urinary Tract: No adrenal nodule or mass. The small focus of soft tissue along the lateral capsule of the interpolar left kidney is similar. Tiny low-density lesion interpolar left renal cortex is stable.No evidence for hydroureter. The urinary bladder  appears normal for the degree of distention. Stomach/Bowel: Stomach is unremarkable. No gastric wall thickening. No evidence of outlet obstruction. Duodenum is normally positioned as is the ligament of Treitz. No small bowel wall thickening. No small bowel dilatation. The terminal ileum is normal. The appendix is normal. No gross colonic mass. No colonic wall thickening. Vascular/Lymphatic: There is abdominal aortic atherosclerosis without aneurysm. No gastrohepatic or hepatoduodenal ligament lymphadenopathy. Scattered small para-aortic lymph nodes are evident. No pelvic sidewall lymphadenopathy. Reproductive: The prostate gland and seminal vesicles are unremarkable. Other: Ill-defined poorly marginated soft tissue nodule in the retroperitoneal space adjacent to the lower pole right kidney has progressed measuring 2.7 x 2.3 cm today compared to 1.8 x 1.7 cm when I remeasure in a similar fashion on the prior study. 12 mm soft tissue nodule inferior to the lower pole right kidney is stable. 7 mm retroperitoneal nodule anterior to the left iliac crest on 92/2 is stable. Tiny nodule in the left paracolic gutter (88/8) is stable. Musculoskeletal: No worrisome lytic or sclerotic osseous abnormality. IMPRESSION: 1. Continued further progression of the ill-defined soft tissue nodule in the right retroperitoneal space adjacent to the lower pole right kidney, now measuring 2.7 x 2.3 cm. Other scattered retroperitoneal soft tissue nodules in the pelvis are stable in the interval. 2. Upper normal mediastinal lymph nodes and borderline increased axillary lymph nodes are similar to prior. 3. Stable 10 mm parahilar right lower lobe pulmonary nodule. 4.  Aortic Atherosclerois (ICD10-170.0) Electronically Signed   By: Misty Stanley M.D.   On: 03/05/2020 10:38   CT Abdomen Pelvis W Contrast  Result Date: 03/05/2020 CLINICAL DATA:  Non-Hodgkin's lymphoma. EXAM: CT CHEST, ABDOMEN, AND PELVIS WITH CONTRAST TECHNIQUE: Multidetector  CT imaging of the chest, abdomen and pelvis was performed following the standard protocol during bolus administration of intravenous contrast. CONTRAST:  128m OMNIPAQUE IOHEXOL 300 MG/ML  SOLN COMPARISON:  09/03/2019 FINDINGS: CT CHEST FINDINGS Cardiovascular: The heart size is normal. No substantial pericardial effusion. No thoracic aortic aneurysm. Mediastinum/Nodes: No mediastinal lymphadenopathy with mediastinal nodes measuring up to maximum of about 9 mm short axis. There is no hilar lymphadenopathy. The esophagus has normal imaging features. Prominent axillary nodes again noted bilaterally, stable since prior including 15 mm left axillary node on 15/2. Lungs/Pleura: The 10 mm parahilar right lower lobe nodule (95/4) is stable. Calcified left lower lobe peripheral granuloma again noted. No focal airspace consolidation. No pleural effusion. Musculoskeletal: No worrisome lytic or sclerotic osseous abnormality. CT ABDOMEN PELVIS FINDINGS Hepatobiliary: No suspicious focal abnormality within the liver parenchyma. There is no evidence for gallstones, gallbladder wall thickening, or pericholecystic fluid. No intrahepatic or extrahepatic biliary dilation. Pancreas: No focal mass lesion. No dilatation of the main duct. No intraparenchymal  cyst. No peripancreatic edema. Spleen: Upper normal craniocaudal splenic length. No focal mass lesion. Adrenals/Urinary Tract: No adrenal nodule or mass. The small focus of soft tissue along the lateral capsule of the interpolar left kidney is similar. Tiny low-density lesion interpolar left renal cortex is stable.No evidence for hydroureter. The urinary bladder appears normal for the degree of distention. Stomach/Bowel: Stomach is unremarkable. No gastric wall thickening. No evidence of outlet obstruction. Duodenum is normally positioned as is the ligament of Treitz. No small bowel wall thickening. No small bowel dilatation. The terminal ileum is normal. The appendix is normal. No  gross colonic mass. No colonic wall thickening. Vascular/Lymphatic: There is abdominal aortic atherosclerosis without aneurysm. No gastrohepatic or hepatoduodenal ligament lymphadenopathy. Scattered small para-aortic lymph nodes are evident. No pelvic sidewall lymphadenopathy. Reproductive: The prostate gland and seminal vesicles are unremarkable. Other: Ill-defined poorly marginated soft tissue nodule in the retroperitoneal space adjacent to the lower pole right kidney has progressed measuring 2.7 x 2.3 cm today compared to 1.8 x 1.7 cm when I remeasure in a similar fashion on the prior study. 12 mm soft tissue nodule inferior to the lower pole right kidney is stable. 7 mm retroperitoneal nodule anterior to the left iliac crest on 92/2 is stable. Tiny nodule in the left paracolic gutter (08/6) is stable. Musculoskeletal: No worrisome lytic or sclerotic osseous abnormality. IMPRESSION: 1. Continued further progression of the ill-defined soft tissue nodule in the right retroperitoneal space adjacent to the lower pole right kidney, now measuring 2.7 x 2.3 cm. Other scattered retroperitoneal soft tissue nodules in the pelvis are stable in the interval. 2. Upper normal mediastinal lymph nodes and borderline increased axillary lymph nodes are similar to prior. 3. Stable 10 mm parahilar right lower lobe pulmonary nodule. 4.  Aortic Atherosclerois (ICD10-170.0) Electronically Signed   By: Misty Stanley M.D.   On: 03/05/2020 10:38      IMPRESSION/PLAN: Non-Hodgkin's Lymphoma, Marginal Zone Type  This is a delightful 59 year old gentleman with a history of marginal zone type non-Hodgkin's lymphoma.  This was diagnosed approximately 3 years ago.  The only treatment he has had is an excisional biopsy of a left perinephric nodule and since diagnosis he has undergone observation.  He has had multiple perinephric nodules but there is one nodule in particular that has behaved more aggressively while the others remain  indolent.  He was referred to me for consideration of focused radiation to the progressive right perinephric nodule.  He would like to delay systemic therapy as long as reasonably possible.  We discussed the risks benefits and side effects of focus radiotherapy to his progressive disease.  He understands and radiation therapy would be given 5 days a week for approximately 3 and half weeks.  He understands that radiation therapy can cause fatigue, GI upset, skin irritation.  The risk of internal organ injury, such as to the kidney, liver, or bowel is very low.  We have a appointment for CT simulation later this afternoon and he would like to proceed with that. I look forward to meeting him later this afternoon for treatment planning and anticipate starting treatment in the middle of next week.  He had very good questions which I answered to the best of my ability and to his satisfaction.  I have entered the prescription and orders for his CT simulation, anticipating 28.8 Gy in 16 fractions with 3D conformal radiotherapy.  This encounter was provided by telemedicine platform MyChart video. The patient has given verbal consent for this  type of encounter and has been advised to only accept a meeting of this type in a secure network environment. The time spent during this encounter, in total, was 50 minutes. The attendants for this meeting include Eppie Gibson  and Lisabeth Register.  During the encounter, Eppie Gibson was located at [facility] Radiation Oncology Department.  Lisabeth Register was located at home.    __________________________________________   Eppie Gibson, MD  This document serves as a record of services personally performed by Eppie Gibson, MD. It was created on her behalf by Wilburn Mylar, a trained medical scribe. The creation of this record is based on the scribe's personal observations and the provider's statements to them. This document has been checked and  approved by the attending provider.

## 2020-03-16 ENCOUNTER — Ambulatory Visit
Admission: RE | Admit: 2020-03-16 | Discharge: 2020-03-16 | Disposition: A | Discharge status: Home / Self Care | Payer: BC Managed Care – PPO | Source: Ambulatory Visit | Attending: Radiation Oncology | Admitting: Radiation Oncology

## 2020-03-16 ENCOUNTER — Other Ambulatory Visit: Payer: Self-pay

## 2020-03-16 ENCOUNTER — Encounter: Payer: Self-pay | Admitting: Radiation Oncology

## 2020-03-16 DIAGNOSIS — C8583 Other specified types of non-Hodgkin lymphoma, intra-abdominal lymph nodes: Secondary | ICD-10-CM | POA: Diagnosis not present

## 2020-03-17 DIAGNOSIS — C8583 Other specified types of non-Hodgkin lymphoma, intra-abdominal lymph nodes: Secondary | ICD-10-CM | POA: Diagnosis not present

## 2020-03-24 ENCOUNTER — Ambulatory Visit
Admission: RE | Admit: 2020-03-24 | Discharge: 2020-03-24 | Disposition: A | Discharge status: Home / Self Care | Payer: BC Managed Care – PPO | Source: Ambulatory Visit | Attending: Radiation Oncology | Admitting: Radiation Oncology

## 2020-03-24 ENCOUNTER — Other Ambulatory Visit: Payer: Self-pay

## 2020-03-24 DIAGNOSIS — C8583 Other specified types of non-Hodgkin lymphoma, intra-abdominal lymph nodes: Secondary | ICD-10-CM | POA: Insufficient documentation

## 2020-03-24 MED ORDER — SONAFINE EX EMUL
1.0000 "application " | Freq: Two times a day (BID) | CUTANEOUS | Status: DC
  Administered 2020-03-24: 1 via TOPICAL

## 2020-03-24 NOTE — Progress Notes (Signed)
Pt here for patient teaching.    Pt given Radiation and You booklet, skin care instructions and Sonafine.    Reviewed areas of pertinence such as diarrhea, fatigue, hair loss, nausea and vomiting, skin changes and urinary and bladder changes .   Pt able to give teach back of to pat skin, use unscented/gentle soap, use baby wipes, have Imodium on hand, drink plenty of water and sitz bath,apply Sonafine bid, avoid applying anything to skin within 4 hours of treatment and to use an electric razor if they must shave.   Pt demonstrated understanding and verbalizes understanding of information given and will contact nursing with any questions or concerns.    Http://rtanswers.org/treatmentinformation/whattoexpect/index  Before leaving, patient mentioned he has a small area on right flank that he "picked at and hasn't healed". Examined area and observed small scabbed site that was raised and red around edges. With Dr. Isidore Moos on PAL asked Dr. Sondra Come to examine site. Samples of triple antibiotic ointment provided per Dr. Clabe Seal recommendation. Will check area again next week during patient's PUT visit

## 2020-03-25 ENCOUNTER — Other Ambulatory Visit: Payer: Self-pay

## 2020-03-25 ENCOUNTER — Ambulatory Visit
Admission: RE | Admit: 2020-03-25 | Discharge: 2020-03-25 | Disposition: A | Discharge status: Home / Self Care | Payer: BC Managed Care – PPO | Source: Ambulatory Visit | Attending: Radiation Oncology | Admitting: Radiation Oncology

## 2020-03-25 DIAGNOSIS — C8583 Other specified types of non-Hodgkin lymphoma, intra-abdominal lymph nodes: Secondary | ICD-10-CM | POA: Diagnosis not present

## 2020-03-26 ENCOUNTER — Other Ambulatory Visit: Payer: Self-pay

## 2020-03-26 ENCOUNTER — Ambulatory Visit
Admission: RE | Admit: 2020-03-26 | Discharge: 2020-03-26 | Disposition: A | Discharge status: Home / Self Care | Payer: BC Managed Care – PPO | Source: Ambulatory Visit | Attending: Radiation Oncology | Admitting: Radiation Oncology

## 2020-03-26 DIAGNOSIS — C8583 Other specified types of non-Hodgkin lymphoma, intra-abdominal lymph nodes: Secondary | ICD-10-CM | POA: Diagnosis not present

## 2020-03-29 ENCOUNTER — Other Ambulatory Visit: Payer: Self-pay

## 2020-03-29 ENCOUNTER — Ambulatory Visit
Admission: RE | Admit: 2020-03-29 | Discharge: 2020-03-29 | Disposition: A | Discharge status: Home / Self Care | Payer: BC Managed Care – PPO | Source: Ambulatory Visit | Attending: Radiation Oncology | Admitting: Radiation Oncology

## 2020-03-29 DIAGNOSIS — C8583 Other specified types of non-Hodgkin lymphoma, intra-abdominal lymph nodes: Secondary | ICD-10-CM | POA: Diagnosis not present

## 2020-03-30 ENCOUNTER — Ambulatory Visit
Admission: RE | Admit: 2020-03-30 | Discharge: 2020-03-30 | Disposition: A | Discharge status: Home / Self Care | Payer: BC Managed Care – PPO | Source: Ambulatory Visit | Attending: Radiation Oncology | Admitting: Radiation Oncology

## 2020-03-30 ENCOUNTER — Other Ambulatory Visit: Payer: Self-pay

## 2020-03-30 DIAGNOSIS — C8583 Other specified types of non-Hodgkin lymphoma, intra-abdominal lymph nodes: Secondary | ICD-10-CM | POA: Diagnosis not present

## 2020-03-31 ENCOUNTER — Ambulatory Visit
Admission: RE | Admit: 2020-03-31 | Discharge: 2020-03-31 | Disposition: A | Discharge status: Home / Self Care | Payer: BC Managed Care – PPO | Source: Ambulatory Visit | Attending: Radiation Oncology | Admitting: Radiation Oncology

## 2020-03-31 ENCOUNTER — Other Ambulatory Visit: Payer: Self-pay

## 2020-03-31 DIAGNOSIS — C8583 Other specified types of non-Hodgkin lymphoma, intra-abdominal lymph nodes: Secondary | ICD-10-CM | POA: Diagnosis not present

## 2020-04-01 ENCOUNTER — Ambulatory Visit
Admission: RE | Admit: 2020-04-01 | Discharge: 2020-04-01 | Disposition: A | Discharge status: Home / Self Care | Payer: BC Managed Care – PPO | Source: Ambulatory Visit | Attending: Radiation Oncology | Admitting: Radiation Oncology

## 2020-04-01 ENCOUNTER — Other Ambulatory Visit: Payer: Self-pay

## 2020-04-01 DIAGNOSIS — C8583 Other specified types of non-Hodgkin lymphoma, intra-abdominal lymph nodes: Secondary | ICD-10-CM | POA: Diagnosis not present

## 2020-04-02 ENCOUNTER — Ambulatory Visit
Admission: RE | Admit: 2020-04-02 | Discharge: 2020-04-02 | Disposition: A | Discharge status: Home / Self Care | Payer: BC Managed Care – PPO | Source: Ambulatory Visit | Attending: Radiation Oncology | Admitting: Radiation Oncology

## 2020-04-02 DIAGNOSIS — C8583 Other specified types of non-Hodgkin lymphoma, intra-abdominal lymph nodes: Secondary | ICD-10-CM | POA: Diagnosis not present

## 2020-04-05 ENCOUNTER — Ambulatory Visit
Admission: RE | Admit: 2020-04-05 | Discharge: 2020-04-05 | Disposition: A | Discharge status: Home / Self Care | Payer: BC Managed Care – PPO | Source: Ambulatory Visit | Attending: Radiation Oncology | Admitting: Radiation Oncology

## 2020-04-05 ENCOUNTER — Other Ambulatory Visit: Payer: Self-pay

## 2020-04-05 DIAGNOSIS — C8583 Other specified types of non-Hodgkin lymphoma, intra-abdominal lymph nodes: Secondary | ICD-10-CM | POA: Diagnosis not present

## 2020-04-06 ENCOUNTER — Ambulatory Visit
Admission: RE | Admit: 2020-04-06 | Discharge: 2020-04-06 | Disposition: A | Discharge status: Home / Self Care | Payer: BC Managed Care – PPO | Source: Ambulatory Visit | Attending: Radiation Oncology | Admitting: Radiation Oncology

## 2020-04-06 ENCOUNTER — Other Ambulatory Visit: Payer: Self-pay

## 2020-04-06 DIAGNOSIS — C8583 Other specified types of non-Hodgkin lymphoma, intra-abdominal lymph nodes: Secondary | ICD-10-CM | POA: Diagnosis not present

## 2020-04-07 ENCOUNTER — Ambulatory Visit
Admission: RE | Admit: 2020-04-07 | Discharge: 2020-04-07 | Disposition: A | Discharge status: Home / Self Care | Payer: BC Managed Care – PPO | Source: Ambulatory Visit | Attending: Radiation Oncology | Admitting: Radiation Oncology

## 2020-04-07 ENCOUNTER — Other Ambulatory Visit: Payer: Self-pay

## 2020-04-07 DIAGNOSIS — C8583 Other specified types of non-Hodgkin lymphoma, intra-abdominal lymph nodes: Secondary | ICD-10-CM | POA: Diagnosis not present

## 2020-04-08 ENCOUNTER — Other Ambulatory Visit: Payer: Self-pay

## 2020-04-08 ENCOUNTER — Ambulatory Visit
Admission: RE | Admit: 2020-04-08 | Discharge: 2020-04-08 | Disposition: A | Discharge status: Home / Self Care | Payer: BC Managed Care – PPO | Source: Ambulatory Visit | Attending: Radiation Oncology | Admitting: Radiation Oncology

## 2020-04-08 DIAGNOSIS — C8583 Other specified types of non-Hodgkin lymphoma, intra-abdominal lymph nodes: Secondary | ICD-10-CM | POA: Diagnosis not present

## 2020-04-09 ENCOUNTER — Other Ambulatory Visit: Payer: Self-pay

## 2020-04-09 ENCOUNTER — Ambulatory Visit
Admission: RE | Admit: 2020-04-09 | Discharge: 2020-04-09 | Disposition: A | Discharge status: Home / Self Care | Payer: BC Managed Care – PPO | Source: Ambulatory Visit | Attending: Radiation Oncology | Admitting: Radiation Oncology

## 2020-04-09 DIAGNOSIS — C8583 Other specified types of non-Hodgkin lymphoma, intra-abdominal lymph nodes: Secondary | ICD-10-CM | POA: Diagnosis not present

## 2020-04-12 ENCOUNTER — Ambulatory Visit
Admission: RE | Admit: 2020-04-12 | Discharge: 2020-04-12 | Disposition: A | Discharge status: Home / Self Care | Payer: BC Managed Care – PPO | Source: Ambulatory Visit | Attending: Radiation Oncology | Admitting: Radiation Oncology

## 2020-04-12 ENCOUNTER — Other Ambulatory Visit: Payer: Self-pay

## 2020-04-12 DIAGNOSIS — C8583 Other specified types of non-Hodgkin lymphoma, intra-abdominal lymph nodes: Secondary | ICD-10-CM | POA: Diagnosis not present

## 2020-04-13 ENCOUNTER — Ambulatory Visit
Admission: RE | Admit: 2020-04-13 | Discharge: 2020-04-13 | Disposition: A | Discharge status: Home / Self Care | Payer: BC Managed Care – PPO | Source: Ambulatory Visit | Attending: Radiation Oncology | Admitting: Radiation Oncology

## 2020-04-13 ENCOUNTER — Other Ambulatory Visit: Payer: Self-pay

## 2020-04-13 DIAGNOSIS — C8583 Other specified types of non-Hodgkin lymphoma, intra-abdominal lymph nodes: Secondary | ICD-10-CM | POA: Diagnosis not present

## 2020-04-14 ENCOUNTER — Other Ambulatory Visit: Payer: Self-pay

## 2020-04-14 ENCOUNTER — Ambulatory Visit
Admission: RE | Admit: 2020-04-14 | Discharge: 2020-04-14 | Disposition: A | Discharge status: Home / Self Care | Payer: BC Managed Care – PPO | Source: Ambulatory Visit | Attending: Radiation Oncology | Admitting: Radiation Oncology

## 2020-04-14 ENCOUNTER — Encounter: Payer: Self-pay | Admitting: Radiation Oncology

## 2020-04-14 DIAGNOSIS — C8583 Other specified types of non-Hodgkin lymphoma, intra-abdominal lymph nodes: Secondary | ICD-10-CM | POA: Diagnosis not present

## 2020-05-12 ENCOUNTER — Telehealth: Payer: Self-pay

## 2020-05-12 NOTE — Telephone Encounter (Addendum)
Patient's wife Nicholas Pope called and left a VM saying that Nicholas Pope was doing very well since completing radiation, and did not feel like he needed to come in to see Nicholas Pope unless it was absolutely necessary. She stated that Nicholas Pope was at work, so it would be better to call her back directly with update.  Returned wife's call. Given the state of the  COVID-19 pandemic, concerning case numbers in our community, and guidance from [facility], I offered a phone assessment with the patient/wife to determine if coming to the clinic was necessary. The wife accepted.  I let the wife know that I had spoken with Nicholas Pope, and she wanted them to know the importance of washing their hands for at least 20 seconds at a time, especially after going out in public, and before they eat. Limit going out in public whenever possible. Do not touch your face, unless your hands are clean, such as when bathing. Get plenty of rest, eat well, and stay hydrated. She verbalized understanding and agreement.  Symptomatically, the patient is doing relatively well. His wife reports that patient had one bout of diarrhea right after finishing radiation, but he took some Imodium and hasn't had an issues since. She denied any urinary or stomach issues. She does report that patient is more tired by the end of the day, but that neither of them are very concerned about it.  All questions were answered to the patient's satisfaction.  I encouraged the patient to call with any further questions. Otherwise, the plan is to follow up with medical oncology (Nicholas Pope) this September, and reach out as needed to Nicholas Pope.    Patient/Wife are pleased with this plan, and we will cancel their upcoming follow-up to reduce the risk of COVID-19 transmission.

## 2020-05-14 ENCOUNTER — Ambulatory Visit: Payer: Self-pay | Admitting: Radiation Oncology

## 2020-06-15 NOTE — Progress Notes (Signed)
  Patient Name: Nicholas Pope MRN: [id number] DOB: 23-Dec-1960 Referring Physician: Zola Button (Profile Not Attached) Date of Service: 04/14/2020 [facility], [place]                                                        End Of Treatment Note  Diagnoses: C85.83-Other specified types of non-hodgkin lymphoma, intra-abdominal lymph nodes  Stage II MALT Lymphoma   Intent: Palliative  Radiation Treatment Dates: 03/24/2020 through 04/14/2020 Site Technique Total Dose (Gy) Dose per Fx (Gy) Completed Fx Beam Energies  Abdomen: Abd_Right 3D 28.8/28.8 1.8 16/16 15X   Narrative: The patient tolerated radiation therapy relatively well to the right perinephric region.   Plan: The patient will follow-up with radiation oncology in 26mo, or as needed.  -----------------------------------  Eppie Gibson, MD

## 2020-08-20 ENCOUNTER — Telehealth: Payer: Self-pay | Admitting: Oncology

## 2020-08-20 NOTE — Telephone Encounter (Signed)
Rescheduled 09/23 appointment to 09/16 per provider pal, called and left a voicemail regarding rescheduled appointments.

## 2020-09-02 ENCOUNTER — Encounter (HOSPITAL_COMMUNITY): Payer: Self-pay

## 2020-09-02 ENCOUNTER — Other Ambulatory Visit: Payer: Self-pay

## 2020-09-02 ENCOUNTER — Inpatient Hospital Stay (HOSPITAL_BASED_OUTPATIENT_CLINIC_OR_DEPARTMENT_OTHER): Payer: BC Managed Care – PPO | Admitting: Oncology

## 2020-09-02 ENCOUNTER — Inpatient Hospital Stay: Payer: BC Managed Care – PPO | Attending: Oncology

## 2020-09-02 ENCOUNTER — Ambulatory Visit (HOSPITAL_COMMUNITY)
Admission: RE | Admit: 2020-09-02 | Discharge: 2020-09-02 | Disposition: A | Discharge status: Home / Self Care | Payer: BC Managed Care – PPO | Source: Ambulatory Visit | Attending: Oncology | Admitting: Oncology

## 2020-09-02 VITALS — BP 165/106 | HR 80 | Temp 98.7°F | Resp 18 | Ht 73.0 in | Wt 244.3 lb

## 2020-09-02 DIAGNOSIS — K439 Ventral hernia without obstruction or gangrene: Secondary | ICD-10-CM | POA: Insufficient documentation

## 2020-09-02 DIAGNOSIS — R161 Splenomegaly, not elsewhere classified: Secondary | ICD-10-CM | POA: Diagnosis not present

## 2020-09-02 DIAGNOSIS — C8593 Non-Hodgkin lymphoma, unspecified, intra-abdominal lymph nodes: Secondary | ICD-10-CM | POA: Insufficient documentation

## 2020-09-02 DIAGNOSIS — R599 Enlarged lymph nodes, unspecified: Secondary | ICD-10-CM | POA: Insufficient documentation

## 2020-09-02 DIAGNOSIS — C8380 Other non-follicular lymphoma, unspecified site: Secondary | ICD-10-CM

## 2020-09-02 DIAGNOSIS — K760 Fatty (change of) liver, not elsewhere classified: Secondary | ICD-10-CM | POA: Diagnosis not present

## 2020-09-02 DIAGNOSIS — Z923 Personal history of irradiation: Secondary | ICD-10-CM | POA: Diagnosis not present

## 2020-09-02 DIAGNOSIS — Z79899 Other long term (current) drug therapy: Secondary | ICD-10-CM | POA: Diagnosis not present

## 2020-09-02 DIAGNOSIS — R911 Solitary pulmonary nodule: Secondary | ICD-10-CM | POA: Diagnosis not present

## 2020-09-02 DIAGNOSIS — R7989 Other specified abnormal findings of blood chemistry: Secondary | ICD-10-CM | POA: Diagnosis not present

## 2020-09-02 LAB — CMP (CANCER CENTER ONLY)
ALT: 131 U/L — ABNORMAL HIGH (ref 0–44)
AST: 91 U/L — ABNORMAL HIGH (ref 15–41)
Albumin: 4.2 g/dL (ref 3.5–5.0)
Alkaline Phosphatase: 90 U/L (ref 38–126)
Anion gap: 11 (ref 5–15)
BUN: 13 mg/dL (ref 6–20)
CO2: 23 mmol/L (ref 22–32)
Calcium: 9.1 mg/dL (ref 8.9–10.3)
Chloride: 102 mmol/L (ref 98–111)
Creatinine: 0.84 mg/dL (ref 0.61–1.24)
GFR, Est AFR Am: >60 mL/min (ref >60)
GFR, Estimated: >60 mL/min (ref >60)
Glucose, Bld: 158 mg/dL — ABNORMAL HIGH (ref 70–99)
Potassium: 4.2 mmol/L (ref 3.5–5.1)
Sodium: 136 mmol/L (ref 135–145)
Total Bilirubin: 0.6 mg/dL (ref 0.3–1.2)
Total Protein: 7.6 g/dL (ref 6.5–8.1)

## 2020-09-02 LAB — CBC WITH DIFFERENTIAL (CANCER CENTER ONLY)
Abs Immature Granulocytes: 0.02 10*3/uL (ref 0.00–0.07)
Basophils Absolute: 0.1 10*3/uL (ref 0.0–0.1)
Basophils Relative: 1 %
Eosinophils Absolute: 0.2 10*3/uL (ref 0.0–0.5)
Eosinophils Relative: 3 %
HCT: 43.9 % (ref 39.0–52.0)
Hemoglobin: 15 g/dL (ref 13.0–17.0)
Immature Granulocytes: 0 %
Lymphocytes Relative: 31 %
Lymphs Abs: 1.7 10*3/uL (ref 0.7–4.0)
MCH: 29 pg (ref 26.0–34.0)
MCHC: 34.2 g/dL (ref 30.0–36.0)
MCV: 84.7 fL (ref 80.0–100.0)
Monocytes Absolute: 0.6 10*3/uL (ref 0.1–1.0)
Monocytes Relative: 11 %
Neutro Abs: 2.9 10*3/uL (ref 1.7–7.7)
Neutrophils Relative %: 54 %
Platelet Count: 227 10*3/uL (ref 150–400)
RBC: 5.18 MIL/uL (ref 4.22–5.81)
RDW: 12.2 % (ref 11.5–15.5)
WBC Count: 5.4 10*3/uL (ref 4.0–10.5)
nRBC: 0 % (ref 0.0–0.2)

## 2020-09-02 MED ORDER — IOHEXOL 300 MG/ML  SOLN
100.0000 mL | Freq: Once | INTRAMUSCULAR | Status: AC | PRN
  Administered 2020-09-02: 100 mL via INTRAVENOUS

## 2020-09-02 NOTE — Progress Notes (Signed)
Hematology and Oncology Follow Up Visit  Nicholas Pope [id number] 04/08/1961 59 y.o. 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     Prior Therapy:  Status post needle biopsy done on 11/07/2016 that was not diagnostic.   He is S/P robotic-assisted laparoscopic excision of a left perinephric mass done by Dr. Alinda Money on 01/08/2017. 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  He is status post radiation therapy to intra-abdominal lymph node recurrence completed in April 2021.  He received 16 fractions for a total of 28.8 Gray   Current therapy: Observation and surveillance.  Interim History: [name] returns today for a follow-up visit.  Since the last visit, he reports no major changes in his health.  He tolerated the radiation therapy without any residual complications.  Denies any nausea vomiting or abdominal pain.  He denies recent hospitalization or illnesses.      Medications: Unchanged on review. Current Outpatient Medications  Medication Sig Dispense Refill  . aspirin 325 MG tablet Take 325 mg by mouth daily.    . fluticasone (FLONASE) 50 MCG/ACT nasal spray Place 2 sprays into both nostrils daily.    . folic acid (FOLVITE) 536 MCG tablet Take 400 mcg by mouth at bedtime.     Marland Kitchen loratadine (CLARITIN) 10 MG tablet Take 10 mg by mouth daily.    . metFORMIN (GLUCOPHAGE) 500 MG tablet     . Multiple Vitamin (MULTIVITAMIN WITH MINERALS) TABS tablet Take 1 tablet by mouth at bedtime. Men's One-A-Day    . omeprazole (PRILOSEC) 40 MG capsule Take 40 mg by mouth at bedtime.      No current facility-administered medications for this visit.     Allergies:  Allergies  Allergen Reactions  . Niacin And Related     flushing       Physical Exam:   Blood  pressure (!) 165/106, pulse 80, temperature 98.7 F (37.1 C), temperature source Tympanic, resp. rate 18, height 6\' 1"  (1.854 m), weight 244 lb 4.8 oz (110.8 kg), SpO2 99 %.     ECOG: 0     General appearance: Comfortable appearing without any discomfort Head: Normocephalic without any trauma Oropharynx: Mucous membranes are moist and pink without any thrush or ulcers. Eyes: Pupils are equal and round reactive to light. Lymph nodes: No cervical, supraclavicular, inguinal or axillary lymphadenopathy.   Heart:regular rate and rhythm.  S1 and S2 without leg edema. Lung: Clear without any rhonchi or wheezes.  No dullness to percussion. Abdomin: Soft, nontender, nondistended with good bowel sounds.  No hepatosplenomegaly. Musculoskeletal: No joint deformity or effusion.  Full range of motion noted. Neurological: No deficits noted on motor, sensory and deep tendon reflex exam. Skin: No petechial rash or dryness.  Appeared moist.          Lab Results: Lab Results  Component Value Date   WBC 5.4 09/02/2020   HGB 15.0 09/02/2020   HCT 43.9 09/02/2020   MCV 84.7 09/02/2020   PLT 227 09/02/2020     Chemistry      Component Value Date/Time   NA 136 09/02/2020 0857   NA 139 10/26/2017 0741   K 4.2 09/02/2020 0857   K 4.3 10/26/2017 0741   CL 102 09/02/2020 0857   CO2 23 09/02/2020 0857   CO2 25 10/26/2017 0741  BUN 13 09/02/2020 0857   BUN 13.5 10/26/2017 0741   CREATININE 0.84 09/02/2020 0857   CREATININE 0.8 10/26/2017 0741      Component Value Date/Time   CALCIUM 9.1 09/02/2020 0857   CALCIUM 9.0 10/26/2017 0741   ALKPHOS 90 09/02/2020 0857   ALKPHOS 87 10/26/2017 0741   AST 91 (H) 09/02/2020 0857   AST 62 (H) 10/26/2017 0741   ALT 131 (H) 09/02/2020 0857   ALT 116 (H) 10/26/2017 0741   BILITOT 0.6 09/02/2020 0857   BILITOT 0.64 10/26/2017 0741      IMPRESSION: 1. Soft tissue adjacent to the RIGHT kidney is nearly completely resolved with minimal  perinephric soft tissue overlying the LEFT kidney also diminished in size. Attention on follow-up. 2. Stable appearance of mildly enlarged lymph nodes in the RIGHT and LEFT axilla with fatty hila. Scattered small mediastinal and retroperitoneal lymph nodes are unchanged. 3. Stable RIGHT lower lobe pulmonary nodule, approximately 9 mm not changed. Attention on follow-up unchanged over at least 3 years time. 4. Subtle ground-glass with peribronchovascular distribution in the RIGHT upper lobe similar to the previous study. Findings with some waxing and waning characteristics over a series of prior studies. Attention on follow-up. 5. Hepatic steatosis. 6. LEFT paramidline ventral hernia contains fat and is not changed. 7. Mild splenic enlargement slightly greater than 12 cm greatest craniocaudal span is stable compared to previous imaging.    Impression and Plan:   59 year old woman with:  1.  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  He is status post radiation therapy outlined above.  Imaging studies obtained on 09/02/2020 were personally reviewed and showed complete resolution of fall is perinephric mass.  No evidence of any disease recurrence noted at this time.  Risks and benefits of continued active surveillance is versus start of systemic therapy.  I have recommended surveillance at this time.  We will repeat scans.    2. Right upper lobe lung mass: Follow-up imaging studies showed stable nodule and appears to be benign.   3.  Elevated liver function test: No hepatic abnormality noted on imaging studies.  LFTs continues to be mildly elevated.  4. Follow-up: In 6 months for repeat imaging studies.  30  minutes were spent on this encounter.  The time was dedicated to reviewing laboratory data, imaging studies and treatment options for the future.  Zola Button, MD 9/16/20213:05 PM

## 2020-09-09 ENCOUNTER — Ambulatory Visit: Payer: BC Managed Care – PPO | Admitting: Oncology

## 2020-11-24 ENCOUNTER — Telehealth: Payer: Self-pay | Admitting: Oncology

## 2020-11-24 NOTE — Telephone Encounter (Signed)
Spoke to patient's wife who requested to reschedule the times of upcoming appointments. Appointments were updated and patient's wife is aware of new times.

## 2020-12-09 ENCOUNTER — Telehealth: Payer: Self-pay | Admitting: Gastroenterology

## 2020-12-09 NOTE — Telephone Encounter (Addendum)
Hi Dr. Loletha Carrow,  We received a referral from PCP for a repeat colonoscopy. We were able to obtain the last colonoscopy report. Sending it to you for review.   Please advise on scheduling.    Thank you

## 2020-12-20 NOTE — Telephone Encounter (Signed)
(  For documentation purposes -colonoscopy with Dr. Wyn Quaker in [place], [place] June 2013 removed multiple colon polyps.  No pathology report in these records, but recent PCP office note refers to "adenomatous polyps" on the 2013 colonoscopy.)  This patient is due for a surveillance colonoscopy for history of colon polyps.  This can be directly booked with me through the [facility].  - H. Myrtie Neither, MD

## 2021-01-31 ENCOUNTER — Other Ambulatory Visit: Payer: Self-pay

## 2021-01-31 ENCOUNTER — Ambulatory Visit (AMBULATORY_SURGERY_CENTER): Payer: Self-pay | Admitting: *Deleted

## 2021-01-31 VITALS — Ht 73.0 in | Wt 252.0 lb

## 2021-01-31 DIAGNOSIS — Z86010 Personal history of colonic polyps: Secondary | ICD-10-CM

## 2021-01-31 MED ORDER — PLENVU 140 G PO SOLR: 1.0000 | Freq: Once | ORAL | 0 refills | Status: AC

## 2021-01-31 NOTE — Progress Notes (Signed)
Patient and wife is here in-person for PV. Patient denies any allergies to eggs or soy. Patient denies any problems with anesthesia/sedation. Patient denies any oxygen use at home. Patient denies taking any diet/weight loss medications or blood thinners. Patient is not being treated for MRSA or C-diff. Patient is aware of our care-partner policy and [id number]-[id number] safety protocol. EMMI education assigned to the patient for the procedure, sent to [facility].   COVID-19 vaccines completed x3, per patient.   Prep Prescription coupon given to the patient.

## 2021-02-09 ENCOUNTER — Encounter: Payer: Self-pay | Admitting: Gastroenterology

## 2021-02-11 ENCOUNTER — Telehealth: Payer: Self-pay | Admitting: Oncology

## 2021-02-11 NOTE — Telephone Encounter (Signed)
Called patient regarding upcoming March appointments, patient is notified. 

## 2021-02-14 ENCOUNTER — Ambulatory Visit (AMBULATORY_SURGERY_CENTER): Payer: BC Managed Care – PPO | Admitting: Gastroenterology

## 2021-02-14 ENCOUNTER — Other Ambulatory Visit: Payer: Self-pay

## 2021-02-14 ENCOUNTER — Encounter: Payer: Self-pay | Admitting: Gastroenterology

## 2021-02-14 VITALS — BP 136/93 | HR 62 | Temp 97.9°F | Resp 15 | Ht 73.0 in | Wt 252.0 lb

## 2021-02-14 DIAGNOSIS — D122 Benign neoplasm of ascending colon: Secondary | ICD-10-CM | POA: Diagnosis not present

## 2021-02-14 DIAGNOSIS — D120 Benign neoplasm of cecum: Secondary | ICD-10-CM

## 2021-02-14 DIAGNOSIS — D124 Benign neoplasm of descending colon: Secondary | ICD-10-CM | POA: Diagnosis not present

## 2021-02-14 DIAGNOSIS — Z86010 Personal history of colonic polyps: Secondary | ICD-10-CM

## 2021-02-14 DIAGNOSIS — D123 Benign neoplasm of transverse colon: Secondary | ICD-10-CM

## 2021-02-14 MED ORDER — SODIUM CHLORIDE 0.9 % IV SOLN: 500.0000 mL | Freq: Once | INTRAVENOUS | Status: DC

## 2021-02-14 NOTE — Progress Notes (Signed)
A/ox3, pleased with MAC, report to RN 

## 2021-02-14 NOTE — Progress Notes (Signed)
Called to room to assist during endoscopic procedure.  Patient ID and intended procedure confirmed with present staff. Received instructions for my participation in the procedure from the performing physician.  

## 2021-02-14 NOTE — Progress Notes (Signed)
Pt's states no medical or surgical changes since previsit or office visit. 

## 2021-02-14 NOTE — Patient Instructions (Signed)
  HANDOUT ON POLYPS GIVEN TO YOU TODAY   AWAIT PATHOLOGY RESULTS ON POLYPS REMOVED    YOU HAD AN ENDOSCOPIC PROCEDURE TODAY AT THE [facility]:   Refer to the procedure report that was given to you for any specific questions about what was found during the examination.  If the procedure report does not answer your questions, please call your gastroenterologist to clarify.  If you requested that your care partner not be given the details of your procedure findings, then the procedure report has been included in a sealed envelope for you to review at your convenience later.  YOU SHOULD EXPECT: Some feelings of bloating in the abdomen. Passage of more gas than usual.  Walking can help get rid of the air that was put into your GI tract during the procedure and reduce the bloating. If you had a lower endoscopy (such as a colonoscopy or flexible sigmoidoscopy) you may notice spotting of blood in your stool or on the toilet paper. If you underwent a bowel prep for your procedure, you may not have a normal bowel movement for a few days.  Please Note:  You might notice some irritation and congestion in your nose or some drainage.  This is from the oxygen used during your procedure.  There is no need for concern and it should clear up in a day or so.  SYMPTOMS TO REPORT IMMEDIATELY:  Following lower endoscopy (colonoscopy or flexible sigmoidoscopy):  Excessive amounts of blood in the stool  Significant tenderness or worsening of abdominal pains  Swelling of the abdomen that is new, acute  Fever of 100F or higher   For urgent or emergent issues, a gastroenterologist can be reached at any hour by calling [phone]. Do not use MyChart messaging for urgent concerns.    DIET:  We do recommend a small meal at first, but then you may proceed to your regular diet.  Drink plenty of fluids but you should avoid alcoholic beverages for 24 hours.  ACTIVITY:  You should plan to take it easy  for the rest of today and you should NOT DRIVE or use heavy machinery until tomorrow (because of the sedation medicines used during the test).    FOLLOW UP: Our staff will call the number listed on your records 48-72 hours following your procedure to check on you and address any questions or concerns that you may have regarding the information given to you following your procedure. If we do not reach you, we will leave a message.  We will attempt to reach you two times.  During this call, we will ask if you have developed any symptoms of COVID 19. If you develop any symptoms (ie: fever, flu-like symptoms, shortness of breath, cough etc.) before then, please call [phone].  If you test positive for Covid 19 in the 2 weeks post procedure, please call and report this information to us.    If any biopsies were taken you will be contacted by phone or by letter within the next 1-3 weeks.  Please call us at [phone] if you have not heard about the biopsies in 3 weeks.    SIGNATURES/CONFIDENTIALITY: You and/or your care partner have signed paperwork which will be entered into your electronic medical record.  These signatures attest to the fact that that the information above on your After Visit Summary has been reviewed and is understood.  Full responsibility of the confidentiality of this discharge information lies with you and/or your   your care-partner. 

## 2021-02-14 NOTE — Op Note (Signed)
[facility] Patient Name: Nicholas Pope Procedure Date: 02/14/2021 8:13 AM MRN: [id number] Endoscopist: Mallie Mussel L. Loletha Carrow , MD Age: 60 Referring MD:  Date of Birth: 1961-04-23 Gender: Male Account #: [id number] Procedure:                Colonoscopy Indications:              Surveillance: Personal history of adenomatous                            polyps on last colonoscopy > 5 years ago (2013,                            [place], [place]) Medicines:                Monitored Anesthesia Care Procedure:                Pre-Anesthesia Assessment:                           - Prior to the procedure, a History and Physical                            was performed, and patient medications and                            allergies were reviewed. The patient's tolerance of                            previous anesthesia was also reviewed. The risks                            and benefits of the procedure and the sedation                            options and risks were discussed with the patient.                            All questions were answered, and informed consent                            was obtained. Prior Anticoagulants: The patient has                            taken no previous anticoagulant or antiplatelet                            agents except for aspirin. ASA Grade Assessment: II                            - A patient with mild systemic disease. After                            reviewing the risks and benefits, the patient was  deemed in satisfactory condition to undergo the                            procedure.                           After obtaining informed consent, the colonoscope                            was passed under direct vision. Throughout the                            procedure, the patient's blood pressure, pulse, and                            oxygen saturations were monitored continuously. The                             Colonoscope was introduced through the anus and                            advanced to the the cecum, identified by                            appendiceal orifice and ileocecal valve. The                            colonoscopy was performed without difficulty. The                            patient tolerated the procedure well. The quality                            of the bowel preparation was excellent. The                            ileocecal valve, appendiceal orifice, and rectum                            were photographed. The bowel preparation used was                            Plenvu. Scope In: 8:25:13 AM Scope Out: 8:53:38 AM Scope Withdrawal Time: 0 hours 26 minutes 20 seconds  Total Procedure Duration: 0 hours 28 minutes 25 seconds  Findings:                 The perianal and digital rectal examinations were                            normal.                           Four sessile polyps were found in the ascending  colon (2) and cecum (2). The polyps were 2 to 5 mm                            in size. These polyps were removed with a cold                            snare. Resection and retrieval were complete. (Jar                            1)                           Two sessile polyps were found in the transverse                            colon. The polyps were 4 mm in size. These polyps                            were removed with a cold snare. Resection and                            retrieval were complete. (Jar 2)                           A diminutive polyp was found in the transverse                            colon. The polyp was flat. The polyp was removed                            with a cold biopsy forceps. Resection and retrieval                            were complete. (Jar 2)                           A 5 mm polyp was found in the proximal descending                            colon. The polyp was sessile. The polyp was removed                             with a cold snare. Resection and retrieval were                            complete. (Jar 3)                           The exam was otherwise without abnormality on                            direct and retroflexion views. Complications:            No immediate  complications. Estimated Blood Loss:     Estimated blood loss was minimal. Impression:               - Four 2 to 5 mm polyps in the ascending colon and                            in the cecum, removed with a cold snare. Resected                            and retrieved.                           - Two 4 mm polyps in the transverse colon, removed                            with a cold snare. Resected and retrieved.                           - One diminutive polyp in the transverse colon,                            removed with a cold biopsy forceps. Resected and                            retrieved.                           - One 5 mm polyp in the proximal descending colon,                            removed with a cold snare. Resected and retrieved.                           - The examination was otherwise normal on direct                            and retroflexion views. Recommendation:           - Patient has a contact number available for                            emergencies. The signs and symptoms of potential                            delayed complications were discussed with the                            patient. Return to normal activities tomorrow.                            Written discharge instructions were provided to the                            patient.                           -  Resume previous diet.                           - Continue present medications.                           - Await pathology results.                           - Repeat colonoscopy is recommended for                            surveillance. The colonoscopy date will be                            determined  after pathology results from today's                            exam become available for review. Ermagene Saidi L. Loletha Carrow, MD 02/14/2021 9:00:42 AM This report has been signed electronically.

## 2021-02-16 ENCOUNTER — Telehealth: Payer: Self-pay

## 2021-02-16 NOTE — Telephone Encounter (Signed)
Attempted to reach patient for post-procedure f/u call. No answer. Left message for him to please not hesitate to call us if he has any questions/concerns regarding his care. 

## 2021-02-16 NOTE — Telephone Encounter (Signed)
Left message on follow up call. 

## 2021-02-23 ENCOUNTER — Encounter: Payer: Self-pay | Admitting: Gastroenterology

## 2021-03-02 ENCOUNTER — Other Ambulatory Visit: Payer: BC Managed Care – PPO

## 2021-03-02 ENCOUNTER — Inpatient Hospital Stay: Payer: BC Managed Care – PPO

## 2021-03-03 ENCOUNTER — Ambulatory Visit (HOSPITAL_COMMUNITY)
Admission: RE | Admit: 2021-03-03 | Discharge: 2021-03-03 | Disposition: A | Discharge status: Home / Self Care | Payer: BC Managed Care – PPO | Source: Ambulatory Visit | Attending: Oncology | Admitting: Oncology

## 2021-03-03 ENCOUNTER — Encounter (HOSPITAL_COMMUNITY): Payer: Self-pay

## 2021-03-03 ENCOUNTER — Other Ambulatory Visit: Payer: Self-pay

## 2021-03-03 ENCOUNTER — Inpatient Hospital Stay: Payer: BC Managed Care – PPO | Attending: Oncology

## 2021-03-03 DIAGNOSIS — K5730 Diverticulosis of large intestine without perforation or abscess without bleeding: Secondary | ICD-10-CM | POA: Diagnosis not present

## 2021-03-03 DIAGNOSIS — I700 Atherosclerosis of aorta: Secondary | ICD-10-CM | POA: Insufficient documentation

## 2021-03-03 DIAGNOSIS — Z923 Personal history of irradiation: Secondary | ICD-10-CM | POA: Diagnosis not present

## 2021-03-03 DIAGNOSIS — R161 Splenomegaly, not elsewhere classified: Secondary | ICD-10-CM | POA: Diagnosis not present

## 2021-03-03 DIAGNOSIS — C8380 Other non-follicular lymphoma, unspecified site: Secondary | ICD-10-CM

## 2021-03-03 DIAGNOSIS — K760 Fatty (change of) liver, not elsewhere classified: Secondary | ICD-10-CM | POA: Diagnosis not present

## 2021-03-03 DIAGNOSIS — Z79899 Other long term (current) drug therapy: Secondary | ICD-10-CM | POA: Diagnosis not present

## 2021-03-03 DIAGNOSIS — R911 Solitary pulmonary nodule: Secondary | ICD-10-CM | POA: Insufficient documentation

## 2021-03-03 DIAGNOSIS — C884 Extranodal marginal zone B-cell lymphoma of mucosa-associated lymphoid tissue [MALT-lymphoma]: Secondary | ICD-10-CM | POA: Diagnosis not present

## 2021-03-03 LAB — CMP (CANCER CENTER ONLY)
ALT: 60 U/L — ABNORMAL HIGH (ref 0–44)
AST: 42 U/L — ABNORMAL HIGH (ref 15–41)
Albumin: 4.3 g/dL (ref 3.5–5.0)
Alkaline Phosphatase: 80 U/L (ref 38–126)
Anion gap: 9 (ref 5–15)
BUN: 15 mg/dL (ref 6–20)
CO2: 25 mmol/L (ref 22–32)
Calcium: 9.1 mg/dL (ref 8.9–10.3)
Chloride: 104 mmol/L (ref 98–111)
Creatinine: 0.86 mg/dL (ref 0.61–1.24)
GFR, Estimated: >60 mL/min (ref >60)
Glucose, Bld: 144 mg/dL — ABNORMAL HIGH (ref 70–99)
Potassium: 4.1 mmol/L (ref 3.5–5.1)
Sodium: 138 mmol/L (ref 135–145)
Total Bilirubin: 0.5 mg/dL (ref 0.3–1.2)
Total Protein: 7.5 g/dL (ref 6.5–8.1)

## 2021-03-03 LAB — CBC WITH DIFFERENTIAL (CANCER CENTER ONLY)
Abs Immature Granulocytes: 0.02 10*3/uL (ref 0.00–0.07)
Basophils Absolute: 0.1 10*3/uL (ref 0.0–0.1)
Basophils Relative: 1 %
Eosinophils Absolute: 0.2 10*3/uL (ref 0.0–0.5)
Eosinophils Relative: 3 %
HCT: 42.2 % (ref 39.0–52.0)
Hemoglobin: 14.2 g/dL (ref 13.0–17.0)
Immature Granulocytes: 0 %
Lymphocytes Relative: 21 %
Lymphs Abs: 1.5 10*3/uL (ref 0.7–4.0)
MCH: 28.9 pg (ref 26.0–34.0)
MCHC: 33.6 g/dL (ref 30.0–36.0)
MCV: 85.8 fL (ref 80.0–100.0)
Monocytes Absolute: 0.6 10*3/uL (ref 0.1–1.0)
Monocytes Relative: 9 %
Neutro Abs: 4.6 10*3/uL (ref 1.7–7.7)
Neutrophils Relative %: 66 %
Platelet Count: 224 10*3/uL (ref 150–400)
RBC: 4.92 MIL/uL (ref 4.22–5.81)
RDW: 12.4 % (ref 11.5–15.5)
WBC Count: 6.9 10*3/uL (ref 4.0–10.5)
nRBC: 0 % (ref 0.0–0.2)

## 2021-03-03 MED ORDER — IOHEXOL 300 MG/ML  SOLN
100.0000 mL | Freq: Once | INTRAMUSCULAR | Status: AC | PRN
  Administered 2021-03-03: 100 mL via INTRAVENOUS

## 2021-03-09 ENCOUNTER — Ambulatory Visit: Payer: BC Managed Care – PPO | Admitting: Oncology

## 2021-03-09 ENCOUNTER — Inpatient Hospital Stay (HOSPITAL_BASED_OUTPATIENT_CLINIC_OR_DEPARTMENT_OTHER): Payer: BC Managed Care – PPO | Admitting: Oncology

## 2021-03-09 ENCOUNTER — Telehealth: Payer: Self-pay | Admitting: Oncology

## 2021-03-09 ENCOUNTER — Other Ambulatory Visit: Payer: Self-pay

## 2021-03-09 VITALS — BP 150/98 | HR 77 | Temp 97.3°F | Resp 20 | Ht 73.0 in | Wt 235.4 lb

## 2021-03-09 DIAGNOSIS — C8380 Other non-follicular lymphoma, unspecified site: Secondary | ICD-10-CM | POA: Diagnosis not present

## 2021-03-09 DIAGNOSIS — C884 Extranodal marginal zone B-cell lymphoma of mucosa-associated lymphoid tissue [MALT-lymphoma]: Secondary | ICD-10-CM | POA: Diagnosis not present

## 2021-03-09 NOTE — Telephone Encounter (Signed)
Scheduled per 03/23 los, patient received updated calender.

## 2021-03-09 NOTE — Progress Notes (Signed)
Hematology and Oncology Follow Up Visit  Nicholas Pope [id number] Jun 16, 1961 60 y.o. 03/09/2021 3:28 PM Nicholas Pope, MDBurdine, [name], MD   Principle Diagnosis: 60 year old man with with marginal zone lymphoma after presenting with a perinephric mass in 2017.     Prior Therapy:  Status post needle biopsy done on 11/07/2016 that was not diagnostic.   He is S/P robotic-assisted laparoscopic excision of a left perinephric mass done by Dr. Alinda Money on 01/08/2017. 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  He is status post radiation therapy to intra-abdominal lymph node recurrence completed in April 2021.  He received 16 fractions for a total of 28.8 Pearline Cables   Current therapy: Active surveillance.  Interim History: Nicholas Pope is here for return evaluation.  Since the last visit, he reported no major changes in his health.  He denies any nausea, vomiting or abdominal pain.  He denies any fevers chills sweats.  He denies any constitutional symptoms.  He has lost weight intentionally and feels better after doing so.      Medications: Updated on review. Current Outpatient Medications  Medication Sig Dispense Refill  . aspirin 325 MG tablet Take 325 mg by mouth daily.    . fluticasone (FLONASE) 50 MCG/ACT nasal spray Place 2 sprays into both nostrils daily.    Marland Kitchen loratadine (CLARITIN) 10 MG tablet Take 10 mg by mouth daily.    . metFORMIN (GLUCOPHAGE) 500 MG tablet     . Multiple Vitamin (MULTIVITAMIN WITH MINERALS) TABS tablet Take 1 tablet by mouth at bedtime. Men's One-A-Day    . omeprazole (PRILOSEC) 40 MG capsule Take 40 mg by mouth at bedtime.      No current facility-administered medications for this visit.     Allergies:  Allergies  Allergen Reactions  . Niacin And Related     flushing       Physical Exam:   Blood pressure (!) 150/98, pulse 77, temperature (!) 97.3 F (36.3 C), temperature source Tympanic, resp. rate 20, height 6'  1" (1.854 m), weight 235 lb 6.4 oz (106.8 kg), SpO2 99 %.     ECOG: 0   General appearance: Alert, awake without any distress. Head: Atraumatic without abnormalities Oropharynx: Without any thrush or ulcers. Eyes: No scleral icterus. Lymph nodes: No lymphadenopathy noted in the cervical, supraclavicular, or axillary nodes Heart:regular rate and rhythm, without any murmurs or gallops.   Lung: Clear to auscultation without any rhonchi, wheezes or dullness to percussion. Abdomin: Soft, nontender without any shifting dullness or ascites. Musculoskeletal: No clubbing or cyanosis. Neurological: No motor or sensory deficits. Skin: No rashes or lesions.          Lab Results: Lab Results  Component Value Date   WBC 6.9 03/03/2021   HGB 14.2 03/03/2021   HCT 42.2 03/03/2021   MCV 85.8 03/03/2021   PLT 224 03/03/2021     Chemistry      Component Value Date/Time   NA 138 03/03/2021 0750   NA 139 10/26/2017 0741   K 4.1 03/03/2021 0750   K 4.3 10/26/2017 0741   CL 104 03/03/2021 0750   CO2 25 03/03/2021 0750   CO2 25 10/26/2017 0741   BUN 15 03/03/2021 0750   BUN 13.5 10/26/2017 0741   CREATININE 0.86 03/03/2021 0750   CREATININE 0.8 10/26/2017 0741      Component Value Date/Time   CALCIUM 9.1 03/03/2021 0750   CALCIUM 9.0 10/26/2017 0741   ALKPHOS 80 03/03/2021 0750  ALKPHOS 87 10/26/2017 0741   AST 42 (H) 03/03/2021 0750   AST 62 (H) 10/26/2017 0741   ALT 60 (H) 03/03/2021 0750   ALT 116 (H) 10/26/2017 0741   BILITOT 0.5 03/03/2021 0750   BILITOT 0.64 10/26/2017 0741       IMPRESSION: 1. Stable size and appearance of the prominent thoracic and abdominopelvic lymph nodes. No suspicious new or enlarging lymph nodes in the chest abdomen or pelvis. 2. No significant change in the minimal left perinephric soft tissue thickening/stranding or the subcentimeter soft tissue nodularity in the bilateral perirenal space. Continued attention on follow-up imaging is  recommended. 3. Right lower lobe parahilar solid pulmonary nodule measuring 10 mm, demonstrating at least 3 years of stability and considered benign. 4. Stable size and appearance of the subtle ground-glass with peribronchovascular distribution in the right upper lobe which demonstrates some waxing and waning over multiple priors, nonspecific but likely infectious or inflammatory. Continued attention on follow-up imaging is recommended. 5. Hepatic steatosis. 6. Mild splenic enlargement, stable over multiple priors. 7. Colonic diverticulosis without findings of acute diverticulitis. 8. Aortic atherosclerosis.  Impression and Plan:   60 year old woman with:  1.  Low-grade lymphoma diagnosed in 2017.  He was found to have marginal zone subtype.    His disease status was updated at this time and treatment options were reiterated.  CT scan obtained on March 03, 2021 was personally reviewed and showed no evidence of relapse or recurrent disease.  Treatment options for this disease were discussed at this time oral targeted therapy, chemotherapy immunotherapy or combination of the above are consideration if he has more systemic disease.  At this time I recommended continued active surveillance.  He is agreeable at this time and will repeat imaging studies in 6 months.  2. Right upper lobe lung mass: Imaging studies does not show any evidence of malignancy.  We will continue to monitor.   3.  Elevated liver function test: Related to hepatic steatosis and appears to be improving after losing weight.  4. Follow-up: He will return in 6 months for repeat evaluation and CT scan.  30  minutes were dedicated to this visit.  The time was spent on reviewing laboratory data, imaging studies discussing treatment options and future plan of care reviewed.  Nicholas Button, MD 3/23/20223:28 PM

## 2021-05-04 IMAGING — CT CT CHEST W/ CM
2 of 5 series · 13 of 36 positions shown, 16 images · IV contrast (OMNIPAQUE)
Comparison: 09/03/2019

CLINICAL DATA: Non-Hodgkin's lymphoma.

EXAM:
CT CHEST, ABDOMEN, AND PELVIS WITH CONTRAST
TECHNIQUE: Multidetector CT imaging of the chest, abdomen and pelvis was
performed following the standard protocol during bolus
administration of intravenous contrast.
CONTRAST:  100mL OMNIPAQUE IOHEXOL 300 MG/ML  SOLN

[Series 2: cap with · axial · 0.94mm/px · z∈[-635,-65]mm · 10 of 140 slices shown, 13 images]
[im 13/140  mediastinal]
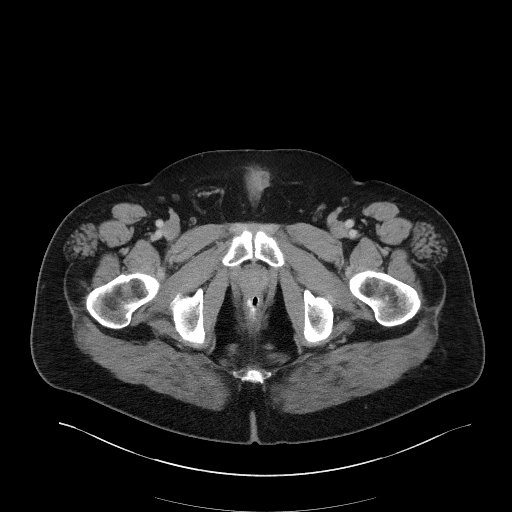
[im 13/140  lung]
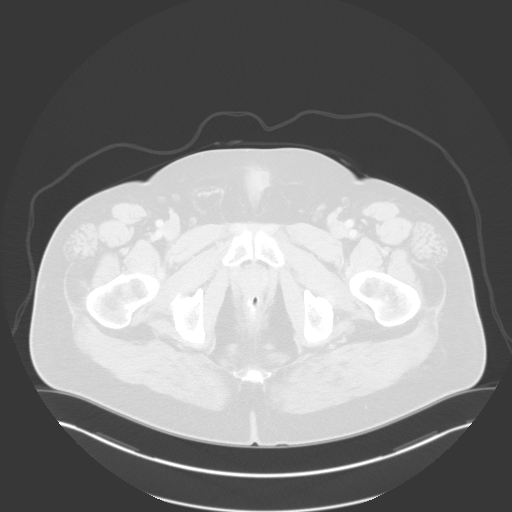
[im 26/140  lung]
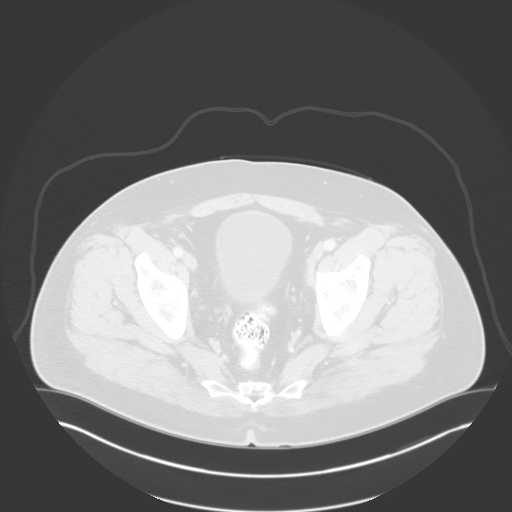
[im 38/140  lung]
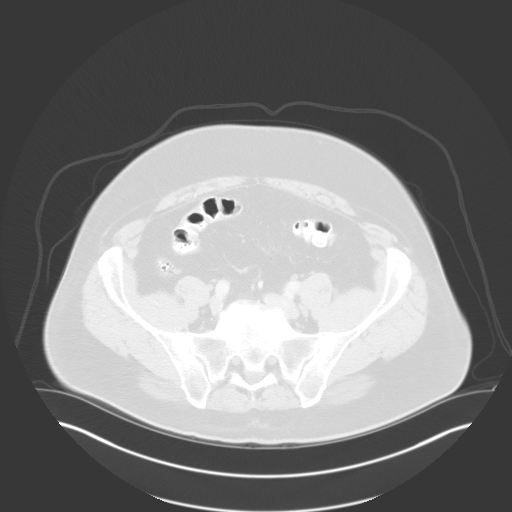
[im 51/140  lung]
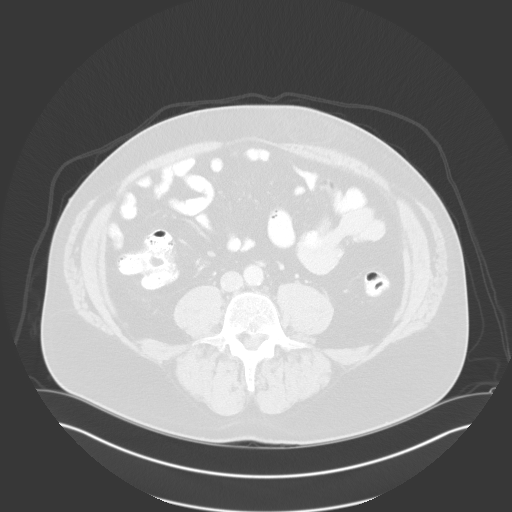
[im 64/140  mediastinal]
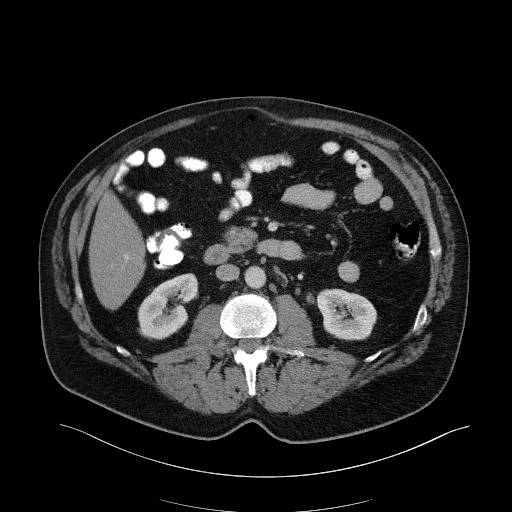
[im 64/140  lung]
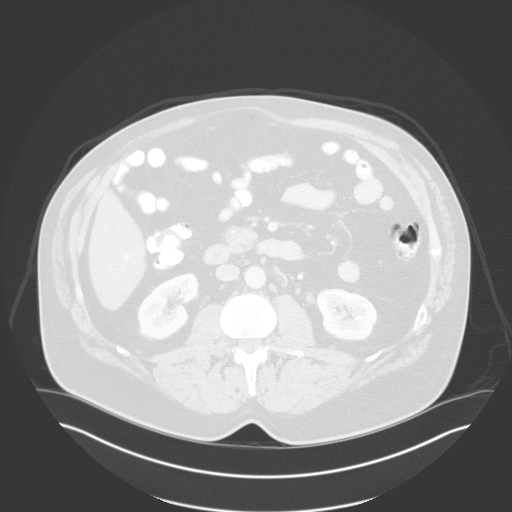
[im 76/140  lung]
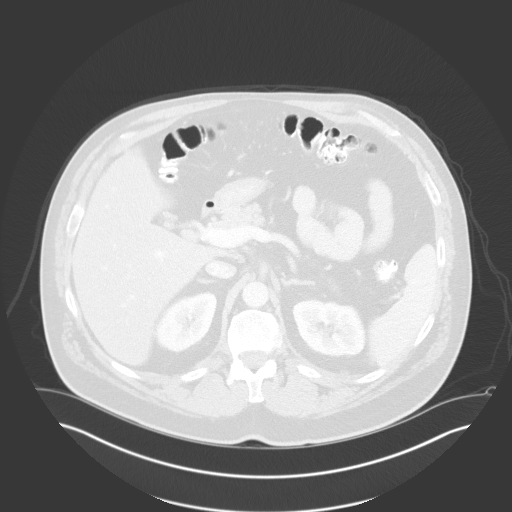
[im 89/140  lung]
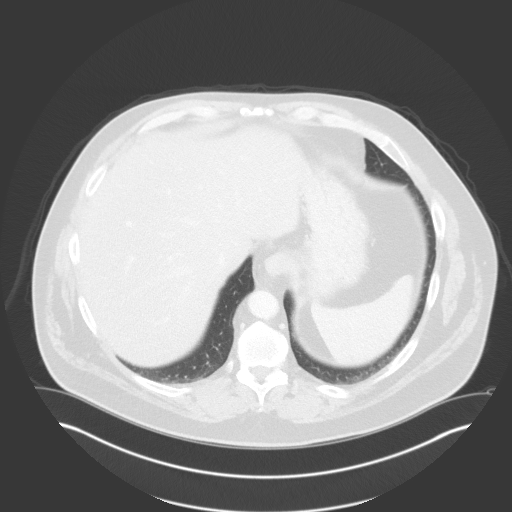
[im 102/140  lung]
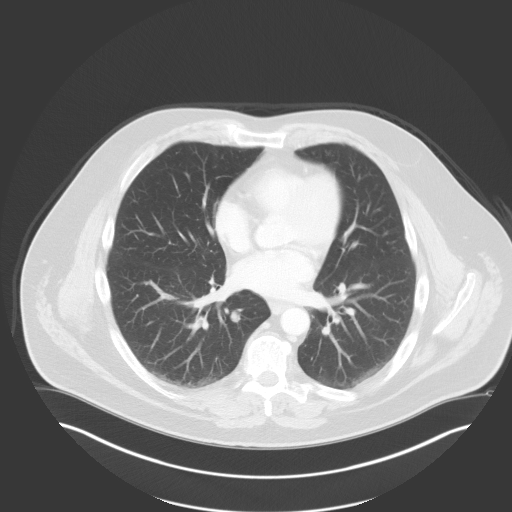
[im 114/140  mediastinal]
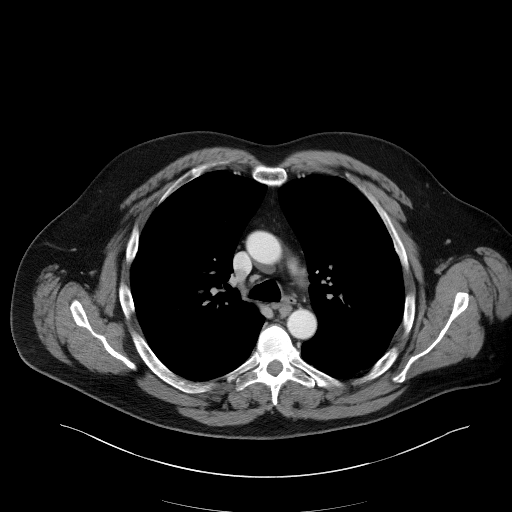
[im 114/140  lung]
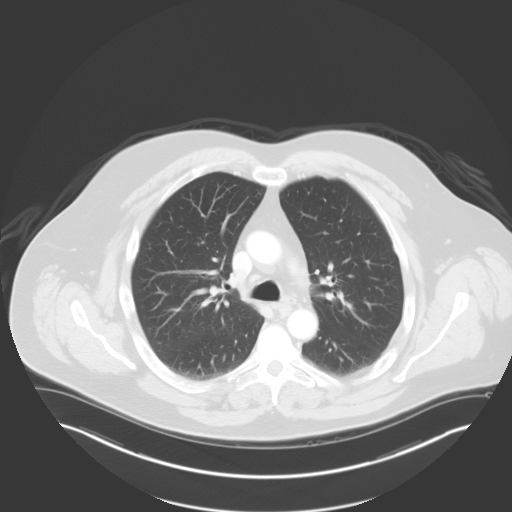
[im 127/140  lung]
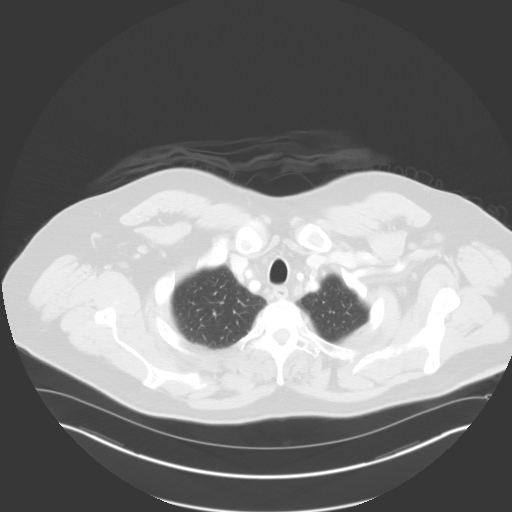

[Series 5: coronals · coronal · 0.98mm/px · 3 of 201 slices shown]
[im 41/201  lung]
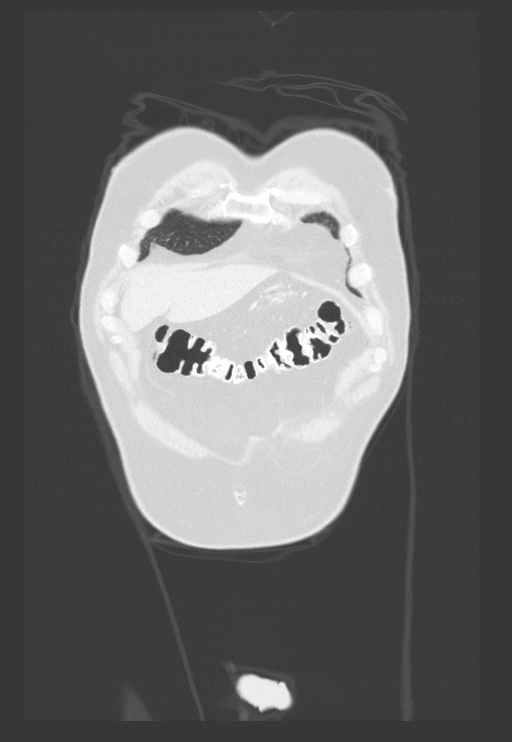
[im 81/201  lung]
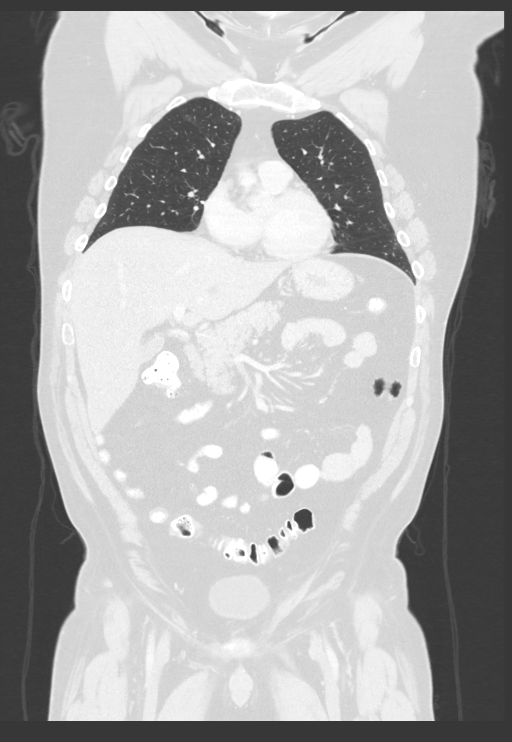
[im 121/201  lung]
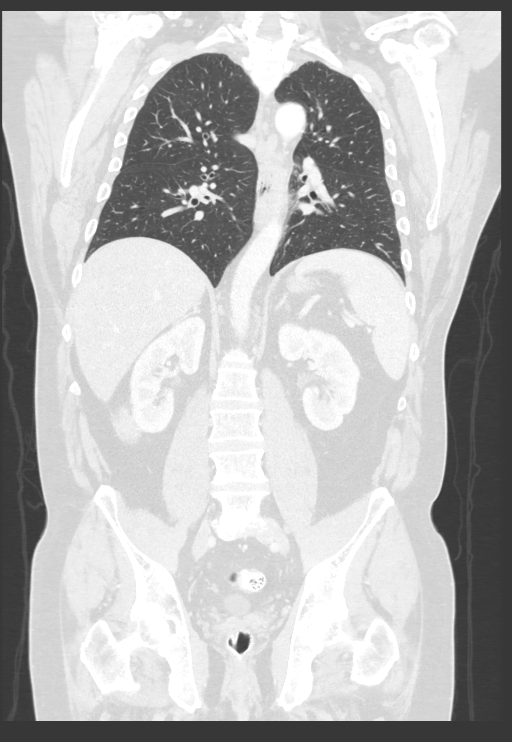

[13 of 36 positions shown; findings below may reference images not displayed]

FINDINGS: CT CHEST FINDINGS

Cardiovascular: The heart size is normal. No substantial pericardial
effusion. No thoracic aortic aneurysm.

Mediastinum/Nodes: No mediastinal lymphadenopathy with mediastinal
nodes measuring up to maximum of about 9 mm short axis. There is no
hilar lymphadenopathy. The esophagus has normal imaging features.
Prominent axillary nodes again noted bilaterally, stable since prior
including 15 mm left axillary node on [DATE].

Lungs/Pleura: The 10 mm parahilar right lower lobe nodule (95/4) is
stable. Calcified left lower lobe peripheral granuloma again noted.
No focal airspace consolidation. No pleural effusion.

Musculoskeletal: No worrisome lytic or sclerotic osseous
abnormality.

CT ABDOMEN PELVIS FINDINGS

Hepatobiliary: No suspicious focal abnormality within the liver
parenchyma. There is no evidence for gallstones, gallbladder wall
thickening, or pericholecystic fluid. No intrahepatic or
extrahepatic biliary dilation.

Pancreas: No focal mass lesion. No dilatation of the main duct. No
intraparenchymal cyst. No peripancreatic edema.

Spleen: Upper normal craniocaudal splenic length. No focal mass
lesion.

Adrenals/Urinary Tract: No adrenal nodule or mass. The small focus
of soft tissue along the lateral capsule of the interpolar left
kidney is similar. Tiny low-density lesion interpolar left renal
cortex is stable.No evidence for hydroureter. The urinary bladder
appears normal for the degree of distention.

Stomach/Bowel: Stomach is unremarkable. No gastric wall thickening.
No evidence of outlet obstruction. Duodenum is normally positioned
as is the ligament of Treitz. No small bowel wall thickening. No
small bowel dilatation. The terminal ileum is normal. The appendix
is normal. No gross colonic mass. No colonic wall thickening.

Vascular/Lymphatic: There is abdominal aortic atherosclerosis
without aneurysm. No gastrohepatic or hepatoduodenal ligament
lymphadenopathy. Scattered small para-aortic lymph nodes are
evident. No pelvic sidewall lymphadenopathy.

Reproductive: The prostate gland and seminal vesicles are
unremarkable.

Other: Ill-defined poorly marginated soft tissue nodule in the
retroperitoneal space adjacent to the lower pole right kidney has
progressed measuring 2.7 x 2.3 cm today compared to 1.8 x 1.7 cm
when I remeasure in a similar fashion on the prior study. 12 mm soft
tissue nodule inferior to the lower pole right kidney is stable. 7
mm retroperitoneal nodule anterior to the left iliac crest on 92/2
is stable. Tiny nodule in the left paracolic gutter (98/2) is
stable.

Musculoskeletal: No worrisome lytic or sclerotic osseous
abnormality.
IMPRESSION: 1. Continued further progression of the ill-defined soft tissue
nodule in the right retroperitoneal space adjacent to the lower pole
right kidney, now measuring 2.7 x 2.3 cm. Other scattered
retroperitoneal soft tissue nodules in the pelvis are stable in the
interval.
2. Upper normal mediastinal lymph nodes and borderline increased
axillary lymph nodes are similar to prior.
3. Stable 10 mm parahilar right lower lobe pulmonary nodule.
4.  Aortic Atherosclerois ([id number])

## 2021-09-02 ENCOUNTER — Other Ambulatory Visit: Payer: BC Managed Care – PPO

## 2021-09-02 ENCOUNTER — Ambulatory Visit (HOSPITAL_COMMUNITY): Payer: BC Managed Care – PPO

## 2021-09-07 ENCOUNTER — Encounter (HOSPITAL_COMMUNITY): Payer: Self-pay

## 2021-09-07 ENCOUNTER — Inpatient Hospital Stay: Payer: BC Managed Care – PPO | Attending: Oncology

## 2021-09-07 ENCOUNTER — Ambulatory Visit (HOSPITAL_COMMUNITY)
Admission: RE | Admit: 2021-09-07 | Discharge: 2021-09-07 | Disposition: A | Discharge status: Home / Self Care | Payer: BC Managed Care – PPO | Source: Ambulatory Visit | Attending: Oncology | Admitting: Oncology

## 2021-09-07 ENCOUNTER — Other Ambulatory Visit: Payer: Self-pay

## 2021-09-07 DIAGNOSIS — R059 Cough, unspecified: Secondary | ICD-10-CM | POA: Insufficient documentation

## 2021-09-07 DIAGNOSIS — Z8616 Personal history of COVID-19: Secondary | ICD-10-CM | POA: Insufficient documentation

## 2021-09-07 DIAGNOSIS — R161 Splenomegaly, not elsewhere classified: Secondary | ICD-10-CM | POA: Insufficient documentation

## 2021-09-07 DIAGNOSIS — Z923 Personal history of irradiation: Secondary | ICD-10-CM | POA: Diagnosis not present

## 2021-09-07 DIAGNOSIS — C8383 Other non-follicular lymphoma, intra-abdominal lymph nodes: Secondary | ICD-10-CM | POA: Insufficient documentation

## 2021-09-07 DIAGNOSIS — R7989 Other specified abnormal findings of blood chemistry: Secondary | ICD-10-CM | POA: Diagnosis not present

## 2021-09-07 DIAGNOSIS — Z79899 Other long term (current) drug therapy: Secondary | ICD-10-CM | POA: Insufficient documentation

## 2021-09-07 DIAGNOSIS — K760 Fatty (change of) liver, not elsewhere classified: Secondary | ICD-10-CM | POA: Insufficient documentation

## 2021-09-07 DIAGNOSIS — C8380 Other non-follicular lymphoma, unspecified site: Secondary | ICD-10-CM | POA: Diagnosis not present

## 2021-09-07 LAB — CMP (CANCER CENTER ONLY)
ALT: 88 U/L — ABNORMAL HIGH (ref 0–44)
AST: 53 U/L — ABNORMAL HIGH (ref 15–41)
Albumin: 4.2 g/dL (ref 3.5–5.0)
Alkaline Phosphatase: 74 U/L (ref 38–126)
Anion gap: 11 (ref 5–15)
BUN: 18 mg/dL (ref 6–20)
CO2: 24 mmol/L (ref 22–32)
Calcium: 9.4 mg/dL (ref 8.9–10.3)
Chloride: 104 mmol/L (ref 98–111)
Creatinine: 0.96 mg/dL (ref 0.61–1.24)
GFR, Estimated: >60 mL/min (ref >60)
Glucose, Bld: 179 mg/dL — ABNORMAL HIGH (ref 70–99)
Potassium: 4.5 mmol/L (ref 3.5–5.1)
Sodium: 139 mmol/L (ref 135–145)
Total Bilirubin: 0.6 mg/dL (ref 0.3–1.2)
Total Protein: 7.5 g/dL (ref 6.5–8.1)

## 2021-09-07 LAB — CBC WITH DIFFERENTIAL (CANCER CENTER ONLY)
Abs Immature Granulocytes: 0.02 10*3/uL (ref 0.00–0.07)
Basophils Absolute: 0 10*3/uL (ref 0.0–0.1)
Basophils Relative: 1 %
Eosinophils Absolute: 0.2 10*3/uL (ref 0.0–0.5)
Eosinophils Relative: 3 %
HCT: 40.4 % (ref 39.0–52.0)
Hemoglobin: 14.2 g/dL (ref 13.0–17.0)
Immature Granulocytes: 0 %
Lymphocytes Relative: 31 %
Lymphs Abs: 1.7 10*3/uL (ref 0.7–4.0)
MCH: 29.3 pg (ref 26.0–34.0)
MCHC: 35.1 g/dL (ref 30.0–36.0)
MCV: 83.3 fL (ref 80.0–100.0)
Monocytes Absolute: 0.5 10*3/uL (ref 0.1–1.0)
Monocytes Relative: 9 %
Neutro Abs: 3.1 10*3/uL (ref 1.7–7.7)
Neutrophils Relative %: 56 %
Platelet Count: 214 10*3/uL (ref 150–400)
RBC: 4.85 MIL/uL (ref 4.22–5.81)
RDW: 12.1 % (ref 11.5–15.5)
WBC Count: 5.4 10*3/uL (ref 4.0–10.5)
nRBC: 0 % (ref 0.0–0.2)

## 2021-09-07 MED ORDER — IOHEXOL 350 MG/ML SOLN
80.0000 mL | Freq: Once | INTRAVENOUS | Status: AC | PRN
  Administered 2021-09-07: 80 mL via INTRAVENOUS

## 2021-09-09 ENCOUNTER — Inpatient Hospital Stay (HOSPITAL_BASED_OUTPATIENT_CLINIC_OR_DEPARTMENT_OTHER): Payer: BC Managed Care – PPO | Admitting: Oncology

## 2021-09-09 ENCOUNTER — Other Ambulatory Visit: Payer: Self-pay

## 2021-09-09 VITALS — BP 133/98 | HR 83 | Temp 97.5°F | Resp 17 | Ht 73.0 in | Wt 237.8 lb

## 2021-09-09 DIAGNOSIS — C8380 Other non-follicular lymphoma, unspecified site: Secondary | ICD-10-CM | POA: Diagnosis not present

## 2021-09-09 DIAGNOSIS — C8383 Other non-follicular lymphoma, intra-abdominal lymph nodes: Secondary | ICD-10-CM | POA: Diagnosis not present

## 2021-09-09 NOTE — Progress Notes (Signed)
Hematology and Oncology Follow Up Visit  Nicholas Pope [id number] 02/22/1961 60 y.o. 09/09/2021 8:31 AM Nicholas Pope, MDBurdine, [name], MD   Principle Diagnosis: 60 year old man with with stage I low-grade lymphoma diagnosed in 2017.  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    Prior Therapy:  Status post needle biopsy done on 11/07/2016 that was not diagnostic.   He is S/P robotic-assisted laparoscopic excision of a left perinephric mass done by Dr. Alinda Money on 01/08/2017. 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  He is status post radiation therapy to intra-abdominal lymph node recurrence completed in April 2021.  He received 16 fractions for a total of 28.8 Pearline Cables   Current therapy: Active surveillance.  Interim History: Mr. Elgersma returns today for a follow-up visit.  Since the last visit, he reports no major complaints.  He was diagnosed with COVID in 22 in August 2022 after presenting with symptoms of cough.  He did have intravenous antibody treatment with bebtelovimab injection 175 mg on August 26.  He has reports of feeling better at this time without any fevers or chills or sweats but does report residual cough.  He denies any constitutional symptoms or weight loss.  He has resumed all work-related duties.      Medications: Reviewed without changes. Current Outpatient Medications  Medication Sig Dispense Refill   aspirin 325 MG tablet Take 325 mg by mouth daily.     fluticasone (FLONASE) 50 MCG/ACT nasal spray Place 2 sprays into both nostrils daily.     loratadine (CLARITIN) 10 MG tablet Take 10 mg by mouth daily.     metFORMIN (GLUCOPHAGE) 500 MG tablet      Multiple Vitamin (MULTIVITAMIN WITH MINERALS) TABS tablet Take 1 tablet by mouth at bedtime. Men's One-A-Day     omeprazole (PRILOSEC) 40 MG capsule Take 40 mg by mouth at bedtime.      No current facility-administered medications for this visit.      Allergies:  Allergies  Allergen Reactions   Niacin And Related     flushing       Physical Exam:    Blood pressure (!) 133/98, pulse 83, temperature (!) 97.5 F (36.4 C), temperature source Tympanic, resp. rate 17, height 6\' 1"  (1.854 m), weight 237 lb 12.8 oz (107.9 kg), SpO2 99 %.     ECOG: 0    General appearance: Comfortable appearing without any discomfort Head: Normocephalic without any trauma Oropharynx: Mucous membranes are moist and pink without any thrush or ulcers. Eyes: Pupils are equal and round reactive to light. Lymph nodes: No cervical, supraclavicular, inguinal or axillary lymphadenopathy.   Heart:regular rate and rhythm.  S1 and S2 without leg edema. Lung: Clear without any rhonchi or wheezes.  No dullness to percussion. Abdomin: Soft, nontender, nondistended with good bowel sounds.  No hepatosplenomegaly. Musculoskeletal: No joint deformity or effusion.  Full range of motion noted. Neurological: No deficits noted on motor, sensory and deep tendon reflex exam. Skin: No petechial rash or dryness.  Appeared moist.          Lab Results: Lab Results  Component Value Date   WBC 5.4 09/07/2021   HGB 14.2 09/07/2021   HCT 40.4 09/07/2021   MCV 83.3 09/07/2021   PLT 214 09/07/2021     Chemistry      Component Value Date/Time   NA 139 09/07/2021 0835   NA 139 10/26/2017 0741   K 4.5 09/07/2021 8850  K 4.3 10/26/2017 0741   CL 104 09/07/2021 0835   CO2 24 09/07/2021 0835   CO2 25 10/26/2017 0741   BUN 18 09/07/2021 0835   BUN 13.5 10/26/2017 0741   CREATININE 0.96 09/07/2021 0835   CREATININE 0.8 10/26/2017 0741      Component Value Date/Time   CALCIUM 9.4 09/07/2021 0835   CALCIUM 9.0 10/26/2017 0741   ALKPHOS 74 09/07/2021 0835   ALKPHOS 87 10/26/2017 0741   AST 53 (H) 09/07/2021 0835   AST 62 (H) 10/26/2017 0741   ALT 88 (H) 09/07/2021 0835   ALT 116 (H) 10/26/2017 0741   BILITOT 0.6 09/07/2021 0835   BILITOT 0.64  10/26/2017 0741        IMPRESSION: 1. Unchanged subtle subcentimeter perirenal soft tissue nodularity and stranding about the kidneys, particularly on the left, consistent with treated lymphoma. 2. Unchanged prominent axillary, mediastinal, and retroperitoneal lymph nodes, without residual or recurrent lymphadenopathy. 3. Unchanged mild splenomegaly. 4. Hepatic steatosis.  Impression and Plan:    60 year old woman with:   1.  Marginal zone lymphoma diagnosed in 2017.  He presented with stage I disease and perinephric lymphadenopathy.     The natural course of his disease was reviewed and treatment options were discussed at this time.  CT scan obtained on September 07, 2021 was personally reviewed and discussed with the patient.  He has no evidence of bulky adenopathy or symptomatic disease to warrant any treatment.  Treatment options including oral targeted therapy with ibrutinib, systemic chemotherapy among other options were reviewed.  These will be deferred unless he has symptomatic disease at this time.  He is agreeable with this plan.  2.  COVID-19 infection: No residual damage noted on his lung imaging.  He still has cough which is likely postnasal drip in nature.   3.  Elevated liver function test: Continues to have elevation in AST and ALT although close to baseline.  This is related to hepatic steatosis and unrelated to malignancy.  4. Follow-up: In 6 months for repeat follow-up.  30  minutes were spent on this encounter.  The time was dedicated to reviewing laboratory data, disease status update, treatment choices and future plan of care discussion.  Zola Button, MD 9/23/20228:31 AM

## 2021-09-21 ENCOUNTER — Encounter: Payer: Self-pay | Admitting: Oncology

## 2022-01-10 ENCOUNTER — Telehealth: Payer: Self-pay | Admitting: Oncology

## 2022-01-10 NOTE — Telephone Encounter (Signed)
Rescheduled March appointment time per scheduling error, patient has been called and notified.

## 2022-03-09 ENCOUNTER — Ambulatory Visit (HOSPITAL_COMMUNITY)
Admission: RE | Admit: 2022-03-09 | Discharge: 2022-03-09 | Disposition: A | Discharge status: Home / Self Care | Payer: BC Managed Care – PPO | Source: Ambulatory Visit | Attending: Oncology | Admitting: Oncology

## 2022-03-09 ENCOUNTER — Inpatient Hospital Stay: Payer: BC Managed Care – PPO | Attending: Oncology

## 2022-03-09 ENCOUNTER — Other Ambulatory Visit: Payer: Self-pay

## 2022-03-09 DIAGNOSIS — Z79899 Other long term (current) drug therapy: Secondary | ICD-10-CM | POA: Insufficient documentation

## 2022-03-09 DIAGNOSIS — C8380 Other non-follicular lymphoma, unspecified site: Secondary | ICD-10-CM | POA: Diagnosis present

## 2022-03-09 DIAGNOSIS — R7989 Other specified abnormal findings of blood chemistry: Secondary | ICD-10-CM | POA: Insufficient documentation

## 2022-03-09 DIAGNOSIS — K439 Ventral hernia without obstruction or gangrene: Secondary | ICD-10-CM | POA: Insufficient documentation

## 2022-03-09 DIAGNOSIS — R351 Nocturia: Secondary | ICD-10-CM | POA: Insufficient documentation

## 2022-03-09 DIAGNOSIS — K760 Fatty (change of) liver, not elsewhere classified: Secondary | ICD-10-CM | POA: Insufficient documentation

## 2022-03-09 DIAGNOSIS — R350 Frequency of micturition: Secondary | ICD-10-CM | POA: Insufficient documentation

## 2022-03-09 DIAGNOSIS — Z923 Personal history of irradiation: Secondary | ICD-10-CM | POA: Insufficient documentation

## 2022-03-09 LAB — CBC WITH DIFFERENTIAL (CANCER CENTER ONLY)
Abs Immature Granulocytes: 0.02 10*3/uL (ref 0.00–0.07)
Basophils Absolute: 0.1 10*3/uL (ref 0.0–0.1)
Basophils Relative: 1 %
Eosinophils Absolute: 0.2 10*3/uL (ref 0.0–0.5)
Eosinophils Relative: 3 %
HCT: 42.1 % (ref 39.0–52.0)
Hemoglobin: 14.9 g/dL (ref 13.0–17.0)
Immature Granulocytes: 0 %
Lymphocytes Relative: 35 %
Lymphs Abs: 1.9 10*3/uL (ref 0.7–4.0)
MCH: 29.5 pg (ref 26.0–34.0)
MCHC: 35.4 g/dL (ref 30.0–36.0)
MCV: 83.4 fL (ref 80.0–100.0)
Monocytes Absolute: 0.6 10*3/uL (ref 0.1–1.0)
Monocytes Relative: 10 %
Neutro Abs: 2.8 10*3/uL (ref 1.7–7.7)
Neutrophils Relative %: 51 %
Platelet Count: 261 10*3/uL (ref 150–400)
RBC: 5.05 MIL/uL (ref 4.22–5.81)
RDW: 12.5 % (ref 11.5–15.5)
WBC Count: 5.5 10*3/uL (ref 4.0–10.5)
nRBC: 0 % (ref 0.0–0.2)

## 2022-03-09 LAB — CMP (CANCER CENTER ONLY)
ALT: 109 U/L — ABNORMAL HIGH (ref 0–44)
AST: 109 U/L — ABNORMAL HIGH (ref 15–41)
Albumin: 4.7 g/dL (ref 3.5–5.0)
Alkaline Phosphatase: 87 U/L (ref 38–126)
Anion gap: 9 (ref 5–15)
BUN: 22 mg/dL — ABNORMAL HIGH (ref 6–20)
CO2: 24 mmol/L (ref 22–32)
Calcium: 9.6 mg/dL (ref 8.9–10.3)
Chloride: 103 mmol/L (ref 98–111)
Creatinine: 0.99 mg/dL (ref 0.61–1.24)
GFR, Estimated: >60 mL/min (ref >60)
Glucose, Bld: 197 mg/dL — ABNORMAL HIGH (ref 70–99)
Potassium: 4.6 mmol/L (ref 3.5–5.1)
Sodium: 136 mmol/L (ref 135–145)
Total Bilirubin: 0.7 mg/dL (ref 0.3–1.2)
Total Protein: 7.9 g/dL (ref 6.5–8.1)

## 2022-03-09 MED ORDER — IOHEXOL 300 MG/ML  SOLN
100.0000 mL | Freq: Once | INTRAMUSCULAR | Status: AC | PRN
  Administered 2022-03-09: 100 mL via INTRAVENOUS

## 2022-03-09 MED ORDER — SODIUM CHLORIDE (PF) 0.9 % IJ SOLN
INTRAMUSCULAR | Status: AC
  Filled 2022-03-09: qty 50

## 2022-03-15 ENCOUNTER — Telehealth: Payer: Self-pay | Admitting: Oncology

## 2022-03-15 NOTE — Telephone Encounter (Signed)
Called patient regarding upcoming appointments, patient is notified. 

## 2022-03-16 ENCOUNTER — Inpatient Hospital Stay: Payer: BC Managed Care – PPO | Admitting: Oncology

## 2022-03-16 ENCOUNTER — Ambulatory Visit: Payer: BC Managed Care – PPO | Admitting: Oncology

## 2022-03-16 ENCOUNTER — Other Ambulatory Visit: Payer: Self-pay

## 2022-03-16 VITALS — BP 116/71 | HR 69 | Temp 97.9°F | Resp 18 | Ht 73.0 in | Wt 238.2 lb

## 2022-03-16 DIAGNOSIS — C8380 Other non-follicular lymphoma, unspecified site: Secondary | ICD-10-CM

## 2022-03-16 DIAGNOSIS — R599 Enlarged lymph nodes, unspecified: Secondary | ICD-10-CM | POA: Diagnosis not present

## 2022-03-16 DIAGNOSIS — K439 Ventral hernia without obstruction or gangrene: Secondary | ICD-10-CM | POA: Diagnosis not present

## 2022-03-16 DIAGNOSIS — R351 Nocturia: Secondary | ICD-10-CM | POA: Diagnosis not present

## 2022-03-16 DIAGNOSIS — K760 Fatty (change of) liver, not elsewhere classified: Secondary | ICD-10-CM | POA: Diagnosis not present

## 2022-03-16 DIAGNOSIS — Z79899 Other long term (current) drug therapy: Secondary | ICD-10-CM | POA: Diagnosis not present

## 2022-03-16 DIAGNOSIS — Z923 Personal history of irradiation: Secondary | ICD-10-CM | POA: Diagnosis not present

## 2022-03-16 DIAGNOSIS — R350 Frequency of micturition: Secondary | ICD-10-CM | POA: Diagnosis not present

## 2022-03-16 DIAGNOSIS — R7989 Other specified abnormal findings of blood chemistry: Secondary | ICD-10-CM | POA: Diagnosis not present

## 2022-03-16 NOTE — Progress Notes (Signed)
Hematology and Oncology Follow Up Visit ? ?Nicholas Pope ?[id number] ?[birth year] 61 y.o. ?03/16/2022 8:09 AM ?Nicholas Pope, MDBurdine, Nicholas Evener, MD  ? ?Principle Diagnosis: 61 year old man with with marginal zone lymphoma diagnosed in 2017.  He presented with a perinephric mass without any need for any additional treatment. ? ?Prior Therapy:  ?Status post needle biopsy done on 11/07/2016 that was not diagnostic.  ? ?He is S/P robotic-assisted laparoscopic excision of a left perinephric mass done by Dr. Alinda Money on 01/08/2017. 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 ? ?He is status post radiation therapy to intra-abdominal lymph node recurrence completed in April 2021.  He received 16 fractions for a total of 28.8 Pearline Cables ? ? ?Current therapy: Active surveillance. ? ?Interim History: [name] presents today for repeat evaluation.  Since the last visit, he reports no major changes in his health.  He does report urinary frequency and occasional nocturia but no other complaints at this time.  He denies any fevers chills or sweats.  He denies any hematuria or dysuria.  His performance status quality of life remains unchanged.  He is trying to lose weight and has been started on amlodipine to increase his blood pressure control. ? ? ? ? ? ?Medications: Updated on review. ?Current Outpatient Medications  ?Medication Sig Dispense Refill  ? aspirin 325 MG tablet Take 325 mg by mouth daily.    ? fluticasone (FLONASE) 50 MCG/ACT nasal spray Place 2 sprays into both nostrils daily.    ? lisinopril (ZESTRIL) 40 MG tablet Take 40 mg by mouth daily.    ? loratadine (CLARITIN) 10 MG tablet Take 10 mg by mouth daily.    ? metFORMIN (GLUCOPHAGE) 500 MG tablet     ? Multiple Vitamin (MULTIVITAMIN WITH MINERALS) TABS tablet Take 1 tablet by mouth at bedtime. Men's One-A-Day    ? omeprazole (PRILOSEC) 40 MG capsule Take 40 mg by mouth at bedtime.     ? ?No current facility-administered medications for  this visit.  ? ? ? ?Allergies:  ?Allergies  ?Allergen Reactions  ? Niacin And Related   ?  flushing  ? ? ? ? ? ?Physical Exam: ? ? ? ? ?Blood pressure 116/71, pulse 69, temperature 97.9 ?F (36.6 ?C), temperature source Temporal, resp. rate 18, height '6\' 1"'$  (1.854 m), weight 238 lb 3.2 oz (108 kg), SpO2 98 %. ? ? ? ? ?ECOG: 0 ? ? ?General appearance: Alert, awake without any distress. ?Head: Atraumatic without abnormalities ?Oropharynx: Without any thrush or ulcers. ?Eyes: No scleral icterus. ?Lymph nodes: No lymphadenopathy noted in the cervical, supraclavicular, or axillary nodes ?Heart:regular rate and rhythm, without any murmurs or gallops.   ?Lung: Clear to auscultation without any rhonchi, wheezes or dullness to percussion. ?Abdomin: Soft, nontender without any shifting dullness or ascites. ?Musculoskeletal: No clubbing or cyanosis. ?Neurological: No motor or sensory deficits. ?Skin: No rashes or lesions. ?Psychiatric: Mood and affect appeared normal. ? ? ? ? ? ? ? ? ?Lab Results: ?Lab Results  ?Component Value Date  ? WBC 5.5 03/09/2022  ? HGB 14.9 03/09/2022  ? HCT 42.1 03/09/2022  ? MCV 83.4 03/09/2022  ? PLT 261 03/09/2022  ? ?  Chemistry   ?   ?Component Value Date/Time  ? NA 136 03/09/2022 0908  ? NA 139 10/26/2017 0741  ? K 4.6 03/09/2022 0908  ? K 4.3 10/26/2017 0741  ? CL 103 03/09/2022 0908  ? CO2 24 03/09/2022 0908  ? CO2 25 10/26/2017 0741  ?  BUN 22 (H) 03/09/2022 0908  ? BUN 13.5 10/26/2017 0741  ? CREATININE 0.99 03/09/2022 0908  ? CREATININE 0.8 10/26/2017 0741  ?    ?Component Value Date/Time  ? CALCIUM 9.6 03/09/2022 0908  ? CALCIUM 9.0 10/26/2017 0741  ? ALKPHOS 87 03/09/2022 0908  ? ALKPHOS 87 10/26/2017 0741  ? AST 109 (H) 03/09/2022 0908  ? AST 62 (H) 10/26/2017 0741  ? ALT 109 (H) 03/09/2022 0908  ? ALT 116 (H) 10/26/2017 0741  ? BILITOT 0.7 03/09/2022 0908  ? BILITOT 0.64 10/26/2017 0741  ?  ? ? ? ?IMPRESSION: ?1. Stable examination without significant interval change in the ?pararenal  soft tissue nodularity/stranding or the prominent ?thoracic/abdominopelvic lymph nodes. No pathologically enlarged ?lymph nodes, splenomegaly or evidence of osseous lymphomatous ?involvement. ?2. Enhancing nodular focus along the inferior aspect of the bladder ?wall at the in interface with the prostate is slightly more ?prominent than on prior studies and while possibly reflecting median ?lobe hypertrophy would consider further evaluation with laboratory ?values and possible Urology consult to assess for a discrete bladder ?or prostate lesion. ?3. Linear ground-glass bands in the right upper lobe are likely ?infectious or inflammatory. ?4. Hepatic steatosis. ?5. Large fat containing ventral hernia. ?  ? ? ? ?Impression and Plan: ? ?  ?61 year old woman with: ?  ?1.  Stage I marginal zone lymphoma diagnosed in 2017.    ? ? ?His disease status was updated at this time and treatment choices were reviewed.  CT scan on March 09, 2022 was personally reviewed and discussed today which showed stable examination without any changes in his lymphadenopathy.  Treatment options including oral targeted therapy versus systemic chemotherapy versus active surveillance were debated at this time.  Given his low volume disease I have recommended continued active surveillance. ? ?2.  Urological considerations: He does appear to have possibly enlarged prostate.  I recommended follow-up with Dr. Alinda Money given his urological symptoms and abnormal prostate imaging. ? ? ?3.  Elevated liver function test: Hepatic steatosis was noted with elevated AST and ALT.  No other pathology noted. ? ?4. Follow-up: He will return in 6 months for repeat evaluation. ? ?30  minutes were dedicated to this visit.  The time spent on reviewing laboratory data, disease status update and outlining future plan of care for the treatment of his condition. ? ?Zola Button, MD ?3/30/20238:09 AM ?

## 2022-03-20 ENCOUNTER — Encounter: Payer: Self-pay | Admitting: Oncology

## 2022-03-20 ENCOUNTER — Other Ambulatory Visit: Payer: Self-pay

## 2022-06-27 ENCOUNTER — Telehealth: Payer: Self-pay | Admitting: Oncology

## 2022-06-27 NOTE — Telephone Encounter (Signed)
Called patient regarding upcoming September appointments, patient has been called and notified. 

## 2022-09-08 ENCOUNTER — Other Ambulatory Visit: Payer: Self-pay

## 2022-09-08 ENCOUNTER — Ambulatory Visit (HOSPITAL_COMMUNITY)
Admission: RE | Admit: 2022-09-08 | Discharge: 2022-09-08 | Disposition: A | Discharge status: Home / Self Care | Payer: BC Managed Care – PPO | Source: Ambulatory Visit | Attending: Oncology | Admitting: Oncology

## 2022-09-08 ENCOUNTER — Inpatient Hospital Stay: Payer: BC Managed Care – PPO | Attending: Oncology

## 2022-09-08 DIAGNOSIS — K760 Fatty (change of) liver, not elsewhere classified: Secondary | ICD-10-CM | POA: Diagnosis not present

## 2022-09-08 DIAGNOSIS — Z923 Personal history of irradiation: Secondary | ICD-10-CM | POA: Diagnosis not present

## 2022-09-08 DIAGNOSIS — C8300 Small cell B-cell lymphoma, unspecified site: Secondary | ICD-10-CM | POA: Insufficient documentation

## 2022-09-08 DIAGNOSIS — Z79899 Other long term (current) drug therapy: Secondary | ICD-10-CM | POA: Insufficient documentation

## 2022-09-08 DIAGNOSIS — N400 Enlarged prostate without lower urinary tract symptoms: Secondary | ICD-10-CM | POA: Insufficient documentation

## 2022-09-08 DIAGNOSIS — R634 Abnormal weight loss: Secondary | ICD-10-CM | POA: Insufficient documentation

## 2022-09-08 DIAGNOSIS — C8380 Other non-follicular lymphoma, unspecified site: Secondary | ICD-10-CM | POA: Insufficient documentation

## 2022-09-08 DIAGNOSIS — R7989 Other specified abnormal findings of blood chemistry: Secondary | ICD-10-CM | POA: Diagnosis not present

## 2022-09-08 DIAGNOSIS — R599 Enlarged lymph nodes, unspecified: Secondary | ICD-10-CM

## 2022-09-08 DIAGNOSIS — R918 Other nonspecific abnormal finding of lung field: Secondary | ICD-10-CM | POA: Diagnosis not present

## 2022-09-08 LAB — CMP (CANCER CENTER ONLY)
ALT: 63 U/L — ABNORMAL HIGH (ref 0–44)
AST: 51 U/L — ABNORMAL HIGH (ref 15–41)
Albumin: 4.4 g/dL (ref 3.5–5.0)
Alkaline Phosphatase: 74 U/L (ref 38–126)
Anion gap: 10 (ref 5–15)
BUN: 16 mg/dL (ref 6–20)
CO2: 24 mmol/L (ref 22–32)
Calcium: 9 mg/dL (ref 8.9–10.3)
Chloride: 103 mmol/L (ref 98–111)
Creatinine: 0.83 mg/dL (ref 0.61–1.24)
GFR, Estimated: >60 mL/min (ref >60)
Glucose, Bld: 230 mg/dL — ABNORMAL HIGH (ref 70–99)
Potassium: 4.1 mmol/L (ref 3.5–5.1)
Sodium: 137 mmol/L (ref 135–145)
Total Bilirubin: 0.5 mg/dL (ref 0.3–1.2)
Total Protein: 7.2 g/dL (ref 6.5–8.1)

## 2022-09-08 LAB — CBC WITH DIFFERENTIAL (CANCER CENTER ONLY)
Abs Immature Granulocytes: 0.02 10*3/uL (ref 0.00–0.07)
Basophils Absolute: 0 10*3/uL (ref 0.0–0.1)
Basophils Relative: 1 %
Eosinophils Absolute: 0.2 10*3/uL (ref 0.0–0.5)
Eosinophils Relative: 3 %
HCT: 41.4 % (ref 39.0–52.0)
Hemoglobin: 14.3 g/dL (ref 13.0–17.0)
Immature Granulocytes: 0 %
Lymphocytes Relative: 30 %
Lymphs Abs: 1.7 10*3/uL (ref 0.7–4.0)
MCH: 28.7 pg (ref 26.0–34.0)
MCHC: 34.5 g/dL (ref 30.0–36.0)
MCV: 83.1 fL (ref 80.0–100.0)
Monocytes Absolute: 0.6 10*3/uL (ref 0.1–1.0)
Monocytes Relative: 10 %
Neutro Abs: 3 10*3/uL (ref 1.7–7.7)
Neutrophils Relative %: 56 %
Platelet Count: 237 10*3/uL (ref 150–400)
RBC: 4.98 MIL/uL (ref 4.22–5.81)
RDW: 12.5 % (ref 11.5–15.5)
WBC Count: 5.5 10*3/uL (ref 4.0–10.5)
nRBC: 0 % (ref 0.0–0.2)

## 2022-09-08 MED ORDER — IOHEXOL 300 MG/ML  SOLN
100.0000 mL | Freq: Once | INTRAMUSCULAR | Status: AC | PRN
  Administered 2022-09-08: 100 mL via INTRAVENOUS

## 2022-09-15 ENCOUNTER — Inpatient Hospital Stay (HOSPITAL_BASED_OUTPATIENT_CLINIC_OR_DEPARTMENT_OTHER): Payer: BC Managed Care – PPO | Admitting: Oncology

## 2022-09-15 ENCOUNTER — Ambulatory Visit: Payer: BC Managed Care – PPO | Admitting: Hematology

## 2022-09-15 ENCOUNTER — Other Ambulatory Visit: Payer: Self-pay

## 2022-09-15 VITALS — BP 150/99 | HR 81 | Temp 97.7°F | Resp 16 | Wt 243.3 lb

## 2022-09-15 DIAGNOSIS — C8300 Small cell B-cell lymphoma, unspecified site: Secondary | ICD-10-CM | POA: Diagnosis not present

## 2022-09-15 DIAGNOSIS — C8380 Other non-follicular lymphoma, unspecified site: Secondary | ICD-10-CM

## 2022-09-15 NOTE — Progress Notes (Signed)
Hematology and Oncology Follow Up Visit  Nicholas Pope [id number] 03-29-1961 61 y.o. 09/15/2022 8:13 AM Nicholas Pope, MDBurdine, [name], MD   Principle Diagnosis: 61 year old man low-grade lymphoma diagnosed in 2017.  He was found to have marginal zone subtype after presenting with incidental perinephric mass.    Prior Therapy:  Status post needle biopsy done on 11/07/2016 that was not diagnostic.   He is S/P robotic-assisted laparoscopic excision of a left perinephric mass done by Dr. Alinda Pope on 01/08/2017. 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  He is status post radiation therapy to intra-abdominal lymph node recurrence completed in April 2021.  He received 16 fractions for a total of 28.8 Pearline Cables   Current therapy: Active surveillance.  Interim History: [name] returns today for a follow-up.  Since the last visit, he reports no major changes in his health.  He has complaint of abdominal hernia although not painful but at times cosmetically unappealing.  He denies any nausea, vomiting or abdominal discomfort.  He denies any constipation or diarrhea.      Medications: Updated on review. Current Outpatient Medications  Medication Sig Dispense Refill   amLODipine (NORVASC) 10 MG tablet Take 10 mg by mouth daily.     aspirin 325 MG tablet Take 325 mg by mouth daily.     fluticasone (FLONASE) 50 MCG/ACT nasal spray Place 2 sprays into both nostrils daily.     lisinopril (ZESTRIL) 40 MG tablet Take 40 mg by mouth daily.     loratadine (CLARITIN) 10 MG tablet Take 10 mg by mouth daily.     metFORMIN (GLUCOPHAGE) 500 MG tablet      Multiple Vitamin (MULTIVITAMIN WITH MINERALS) TABS tablet Take 1 tablet by mouth at bedtime. Men's One-A-Day     omeprazole (PRILOSEC) 40 MG capsule Take 40 mg by mouth at bedtime.      No current facility-administered medications for this visit.     Allergies:  Allergies  Allergen Reactions   Niacin And Related      flushing       Physical Exam:    Blood pressure (!) 150/99, pulse 81, temperature 97.7 F (36.5 C), resp. rate 16, weight 243 lb 4.8 oz (110.4 kg), SpO2 97 %.       ECOG: 0    General appearance: Comfortable appearing without any discomfort Head: Normocephalic without any trauma Oropharynx: Mucous membranes are moist and pink without any thrush or ulcers. Eyes: Pupils are equal and round reactive to light. Lymph nodes: No cervical, supraclavicular, inguinal or axillary lymphadenopathy.   Heart:regular rate and rhythm.  S1 and S2 without leg edema. Lung: Clear without any rhonchi or wheezes.  No dullness to percussion. Abdomin: Soft, nontender, nondistended with good bowel sounds.  No hepatosplenomegaly.  Incisional hernia noted without any tenderness or erythema on the skin. Musculoskeletal: No joint deformity or effusion.  Full range of motion noted. Neurological: No deficits noted on motor, sensory and deep tendon reflex exam. Skin: No petechial rash or dryness.  Appeared moist.           Lab Results: Lab Results  Component Value Date   WBC 5.5 09/08/2022   HGB 14.3 09/08/2022   HCT 41.4 09/08/2022   MCV 83.1 09/08/2022   PLT 237 09/08/2022     Chemistry      Component Value Date/Time   NA 137 09/08/2022 0733   NA 139 10/26/2017 0741   K 4.1 09/08/2022 0733   K 4.3  10/26/2017 0741   CL 103 09/08/2022 0733   CO2 24 09/08/2022 0733   CO2 25 10/26/2017 0741   BUN 16 09/08/2022 0733   BUN 13.5 10/26/2017 0741   CREATININE 0.83 09/08/2022 0733   CREATININE 0.8 10/26/2017 0741      Component Value Date/Time   CALCIUM 9.0 09/08/2022 0733   CALCIUM 9.0 10/26/2017 0741   ALKPHOS 74 09/08/2022 0733   ALKPHOS 87 10/26/2017 0741   AST 51 (H) 09/08/2022 0733   AST 62 (H) 10/26/2017 0741   ALT 63 (H) 09/08/2022 0733   ALT 116 (H) 10/26/2017 0741   BILITOT 0.5 09/08/2022 0733   BILITOT 0.64 10/26/2017 0741     IMPRESSION: 1. Slight interval  enlargement of left-sided perirenal soft tissue nodules and amorphous soft tissue along the inferior pole of left kidney, consistent with worsened lymphoma. 2. Unchanged prominent subcentimeter mediastinal and retroperitoneal lymph nodes. No overt lymphadenopathy in the chest, abdomen, or pelvis at this time. 3. Fluctuant, scattered nonspecific infectious or inflammatory ground-glass opacities in the right upper lobe. Diffuse bilateral bronchial wall thickening, consistent with nonspecific infectious or inflammatory bronchitis. 4. Hepatic steatosis. 5. Mild prostatomegaly with unchanged median lobe hypertrophy.        Impression and Plan:    61 year old woman with:   1.  Marginal zone lymphoma diagnosed in 2017 after presenting with a perinephric lymph node.  He was found to have stage I disease at that time.   He continues to be on active surveillance at this time without needing systemic therapy.  CT scan obtained on September 08, 2022 was personally reviewed and discussed with the patient.  He remains asymptomatic and very little progression noted in the perinephric area.  Treatment choices at this time were discussed including continued active surveillance, local therapy versus systemic therapy options.  Systemic therapy options include rituximab with Nicholas Pope, rituximab with Nicholas Pope for single agent rituximab among others.  After discussion today, given his limited disease fact that he is asymptomatic I recommended continued active surveillance with repeat imaging studies in 6 months.  2.  Abdominal hernia: I offered him a referral to general surgery but the likelihood of needing an operation is low at this time given lack of symptoms or concerns for strangulation.  He will consider that and let me know.   3.  Elevated liver function test: Continues to improve with the weight loss.  This is likely related to hepatic steatosis.  4. Follow-up: In 6 months for a  follow-up.  30  minutes were spent on this encounter.  The time was dedicated to updating his disease status, treatment choices and outlining future plan of care reviewed.  Nicholas Button, MD 9/29/20238:13 AM

## 2023-01-02 ENCOUNTER — Telehealth: Payer: Self-pay | Admitting: Oncology

## 2023-01-02 NOTE — Telephone Encounter (Signed)
Called patient regarding providers departure, patient is notified. Rescheduled patient with new provider.

## 2023-03-15 ENCOUNTER — Encounter: Payer: Self-pay | Admitting: Hematology

## 2023-03-15 ENCOUNTER — Inpatient Hospital Stay: Payer: BC Managed Care – PPO

## 2023-03-15 ENCOUNTER — Inpatient Hospital Stay: Payer: BC Managed Care – PPO | Attending: Hematology | Admitting: Hematology

## 2023-03-15 ENCOUNTER — Ambulatory Visit: Payer: BC Managed Care – PPO | Admitting: Hematology

## 2023-03-15 VITALS — BP 149/82 | HR 86 | Temp 98.0°F | Resp 18 | Ht 72.0 in | Wt 246.0 lb

## 2023-03-15 DIAGNOSIS — Z87891 Personal history of nicotine dependence: Secondary | ICD-10-CM | POA: Diagnosis not present

## 2023-03-15 DIAGNOSIS — C884 Extranodal marginal zone B-cell lymphoma of mucosa-associated lymphoid tissue [MALT-lymphoma]: Secondary | ICD-10-CM | POA: Diagnosis present

## 2023-03-15 DIAGNOSIS — Z905 Acquired absence of kidney: Secondary | ICD-10-CM

## 2023-03-15 DIAGNOSIS — Z79899 Other long term (current) drug therapy: Secondary | ICD-10-CM

## 2023-03-15 DIAGNOSIS — Z923 Personal history of irradiation: Secondary | ICD-10-CM

## 2023-03-15 DIAGNOSIS — C8580 Other specified types of non-Hodgkin lymphoma, unspecified site: Secondary | ICD-10-CM | POA: Insufficient documentation

## 2023-03-15 LAB — CBC WITH DIFFERENTIAL/PLATELET
Abs Immature Granulocytes: 0.01 10*3/uL (ref 0.00–0.07)
Basophils Absolute: 0.1 10*3/uL (ref 0.0–0.1)
Basophils Relative: 1 %
Eosinophils Absolute: 0.1 10*3/uL (ref 0.0–0.5)
Eosinophils Relative: 2 %
HCT: 42.3 % (ref 39.0–52.0)
Hemoglobin: 14.6 g/dL (ref 13.0–17.0)
Immature Granulocytes: 0 %
Lymphocytes Relative: 24 %
Lymphs Abs: 1.7 10*3/uL (ref 0.7–4.0)
MCH: 29 pg (ref 26.0–34.0)
MCHC: 34.5 g/dL (ref 30.0–36.0)
MCV: 84.1 fL (ref 80.0–100.0)
Monocytes Absolute: 0.7 10*3/uL (ref 0.1–1.0)
Monocytes Relative: 10 %
Neutro Abs: 4.5 10*3/uL (ref 1.7–7.7)
Neutrophils Relative %: 63 %
Platelets: 238 10*3/uL (ref 150–400)
RBC: 5.03 MIL/uL (ref 4.22–5.81)
RDW: 12.6 % (ref 11.5–15.5)
WBC: 7.1 10*3/uL (ref 4.0–10.5)
nRBC: 0 % (ref 0.0–0.2)

## 2023-03-15 LAB — COMPREHENSIVE METABOLIC PANEL
ALT: 87 U/L — ABNORMAL HIGH (ref 0–44)
AST: 78 U/L — ABNORMAL HIGH (ref 15–41)
Albumin: 4.3 g/dL (ref 3.5–5.0)
Alkaline Phosphatase: 90 U/L (ref 38–126)
Anion gap: 11 (ref 5–15)
BUN: 12 mg/dL (ref 8–23)
CO2: 21 mmol/L — ABNORMAL LOW (ref 22–32)
Calcium: 8.9 mg/dL (ref 8.9–10.3)
Chloride: 102 mmol/L (ref 98–111)
Creatinine, Ser: 0.73 mg/dL (ref 0.61–1.24)
GFR, Estimated: >60 mL/min (ref >60)
Glucose, Bld: 196 mg/dL — ABNORMAL HIGH (ref 70–99)
Potassium: 4.3 mmol/L (ref 3.5–5.1)
Sodium: 134 mmol/L — ABNORMAL LOW (ref 135–145)
Total Bilirubin: 0.9 mg/dL (ref 0.3–1.2)
Total Protein: 7.5 g/dL (ref 6.5–8.1)

## 2023-03-15 LAB — LACTATE DEHYDROGENASE: LDH: 184 U/L (ref 98–192)

## 2023-03-15 NOTE — Progress Notes (Signed)
[facility] [address] [place] [address]   Clinic Day:  03/15/2023  Referring physician: [name], MD  Patient Care Team: [name], MD as PCP - General (Family Medicine) Derek Jack, MD as Medical Oncologist (Medical Oncology)   ASSESSMENT & PLAN:   Assessment:  1.  Stage II marginal zone lymphoma: - Left perirenal mass needle biopsy (11/07/2016): Atypical lymphoid proliferation suspicious for lymphoma. - Robotic assisted laparoscopic excision of the left perinephric mass by Dr. Alinda Money on 01/08/2017 - Pathology: Non-Hodgkin's lymphoma, favor marginal zone type. - CT CAP (03/05/2020): Progression of soft tissue nodule in the right retroperitoneal space, grew from 1.8 cm to 2.7 cm. - XRT to the right perinephric region from 03/24/2020 through 04/14/2020, 28.8 Gray in 16 fractions  2.  Social/family history: - He is seen with his wife today.  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  Uses [employer] for many years.  Non-smoker. - No family history of malignancies.  Plan:  1.  Stage II marginal zone lymphoma: - He does not have any B symptoms or infections. - Physical exam today did not show any palpable adenopathy or splenomegaly. - Reviewed labs today which showed normal LDH, CBC.  LFTs including AST and ALT are slightly elevated and stable. - No indication for imaging at this time.  I will see him back in 6 months for follow-up.  I will plan to repeat CT CAP at that time.   Orders Placed This Encounter  Procedures   CT CHEST ABDOMEN PELVIS W CONTRAST    Standing Status:   Future    Standing Expiration Date:   03/14/2024    Order Specific Question:   Preferred imaging location?    Answer:   [facility]    Order Specific Question:   Release to patient    Answer:   Immediate    Order Specific Question:   Radiology Contrast Protocol - do NOT remove file path    Answer:    [email or web address]   CBC with Differential    Standing Status:   Future    Number of Occurrences:   1    Standing Expiration Date:   03/14/2024   Comprehensive metabolic panel    Standing Status:   Future    Number of Occurrences:   1    Standing Expiration Date:   03/14/2024   Lactate dehydrogenase    Standing Status:   Future    Number of Occurrences:   1    Standing Expiration Date:   03/14/2024   CBC with Differential    Standing Status:   Future    Number of Occurrences:   1    Standing Expiration Date:   03/14/2024    I,Alexis Herring,acting as a scribe for Derek Jack, MD.,have documented all relevant documentation on the behalf of Derek Jack, MD,as directed by  Derek Jack, MD while in the presence of Derek Jack, MD.   I, Derek Jack MD, have reviewed the above documentation for accuracy and completeness, and I agree with the above.   Derek Jack, MD   3/28/20245:20 PM  CHIEF COMPLAINT/PURPOSE OF CONSULT:   Diagnosis:  low-grade lymphoma diagnosed in 2017    Cancer Staging  Marginal zone lymphoma [facility]) Staging form: Hodgkin and Non-Hodgkin Lymphoma, AJCC 8th Edition - Clinical stage from 03/15/2023: Stage II (Marginal zone lymphoma) - Unsigned    Prior Therapy:  Status post needle biopsy done on 11/07/2016  that was not diagnostic robotic-assisted laparoscopic excision of a left perinephric mass done by Dr. Alinda Money on 01/08/2017- pathology confirmed the presence of marginal zone lymphoma  radiation therapy to intra-abdominal lymph node recurrence completed in April 2021. He received 16 fractions for a total of 28.8 Pearline Cables   Current Therapy:  surveillance  HISTORY OF PRESENT ILLNESS:   Oncology History   No history exists.     Nicholas Pope is a 62 y.o. male presenting to clinic today for evaluation of  low-grade lymphoma as a transfer of care from Dr. Alen Blew. He was last seen by Dr.  Alen Blew on 09/09/22.  Today, he states that he is doing well overall. His appetite level is at 100%. His energy level is at 100%. He denies any new pains. He denies any fevers, chills, night sweats, weight loss, or recent infections.  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 He reports using PPI when spraying these chemicals. He has never been a smoker. Patient denies any known family history of cancers.  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  PAST MEDICAL HISTORY:   Past Medical History: Past Medical History:  Diagnosis Date   Cancer ([place] ) 2018   non-Hodgins lymphoma    Diabetes mellitus without complication (HCC)    GERD (gastroesophageal reflux disease)    History of bronchitis as a child    Pneumonia    hx of    PONV (postoperative nausea and vomiting)     Surgical History: Past Surgical History:  Procedure Laterality Date   COLONOSCOPY  05/2012   [place]   KNEE ARTHROPLASTY Left 1980's   torn cartilage    LYMPH NODE BIOPSY     POLYPECTOMY     ROBOTIC ASSITED PARTIAL NEPHRECTOMY Left 01/08/2017   Procedure: XI ROBOTIC ASSITED RESECTION OF PERINEPHRIC MASS;  Surgeon: Raynelle Bring, MD;  Location: [facility];  Service: Urology;  Laterality: Left;   UMBILICAL HERNIA REPAIR     as a child    Social History: Social History   Socioeconomic History   Marital status: Married    Spouse name: Not on file   Number of children: Not on file   Years of education: Not on file   Highest education level: Not on file  Occupational History   Not on file  Tobacco Use   Smoking status: Never   Smokeless tobacco: Former    Types: Chew    Quit date: 2018  Vaping Use   Vaping Use: Never used  Substance and Sexual Activity   Alcohol use: No   Drug use: No   Sexual activity: Not on file  Other Topics Concern   Not on file  Social History Narrative   Not on file   Social Determinants of Health   Financial Resource Strain:  Not on file  Food Insecurity: No Food Insecurity (03/15/2023)   Hunger Vital Sign    Worried About Running Out of Food in the Last Year: Never true    Ran Out of Food in the Last Year: Never true  Transportation Needs: No Transportation Needs (03/15/2023)   PRAPARE - Hydrologist (Medical): No    Lack of Transportation (Non-Medical): No  Physical Activity: Not on file  Stress: Not on file  Social Connections: Not on file  Intimate Partner Violence: Not At Risk (03/15/2023)   Humiliation, Afraid, Rape, and Kick questionnaire    Fear of Current or Ex-Partner:  No    Emotionally Abused: No    Physically Abused: No    Sexually Abused: No    Family History: Family History  Problem Relation Age of Onset   Colon cancer Neg Hx    Colon polyps Neg Hx    Esophageal cancer Neg Hx    Rectal cancer Neg Hx    Stomach cancer Neg Hx     Current Medications:  Current Outpatient Medications:    amLODipine (NORVASC) 10 MG tablet, Take 10 mg by mouth daily., Disp: , Rfl:    aspirin 325 MG tablet, Take 325 mg by mouth daily., Disp: , Rfl:    fluticasone (FLONASE) 50 MCG/ACT nasal spray, Place 2 sprays into both nostrils daily., Disp: , Rfl:    lisinopril (ZESTRIL) 40 MG tablet, Take 40 mg by mouth daily., Disp: , Rfl:    loratadine (CLARITIN) 10 MG tablet, Take 10 mg by mouth daily., Disp: , Rfl:    metFORMIN (GLUCOPHAGE) 500 MG tablet, , Disp: , Rfl:    Multiple Vitamin (MULTIVITAMIN WITH MINERALS) TABS tablet, Take 1 tablet by mouth at bedtime. Men's One-A-Day, Disp: , Rfl:    omeprazole (PRILOSEC) 40 MG capsule, Take 40 mg by mouth at bedtime. , Disp: , Rfl:    Allergies: Allergies  Allergen Reactions   Niacin And Related     flushing    REVIEW OF SYSTEMS:   Review of Systems  Constitutional:  Negative for chills, fatigue and fever.  HENT:   Negative for lump/mass, mouth sores, nosebleeds, sore throat and trouble swallowing.   Eyes:  Negative for eye  problems.  Respiratory:  Negative for cough and shortness of breath.   Cardiovascular:  Negative for chest pain, leg swelling and palpitations.  Gastrointestinal:  Negative for abdominal pain, constipation, diarrhea, nausea and vomiting.  Genitourinary:  Negative for bladder incontinence, difficulty urinating, dysuria, frequency, hematuria and nocturia.   Musculoskeletal:  Negative for arthralgias, back pain, flank pain, myalgias and neck pain.  Skin:  Negative for itching and rash.  Neurological:  Negative for dizziness, headaches and numbness.  Hematological:  Does not bruise/bleed easily.  Psychiatric/Behavioral:  Negative for depression, sleep disturbance and suicidal ideas. The patient is not nervous/anxious.   All other systems reviewed and are negative.    VITALS:   Blood pressure (!) 149/82, pulse 86, temperature 98 F (36.7 C), resp. rate 18, height 6' (1.829 m), weight 246 lb (111.6 kg), SpO2 96 %.  Wt Readings from Last 3 Encounters:  03/15/23 246 lb (111.6 kg)  09/15/22 243 lb 4.8 oz (110.4 kg)  03/16/22 238 lb 3.2 oz (108 kg)    Body mass index is 33.36 kg/m.  Performance status (ECOG): 0 - Asymptomatic  PHYSICAL EXAM:   Physical Exam Vitals and nursing note reviewed. Exam conducted with a chaperone present.  Constitutional:      Appearance: Normal appearance.  Cardiovascular:     Rate and Rhythm: Normal rate and regular rhythm.     Pulses: Normal pulses.     Heart sounds: Normal heart sounds.  Pulmonary:     Effort: Pulmonary effort is normal.     Breath sounds: Normal breath sounds.  Abdominal:     Palpations: Abdomen is soft. There is no hepatomegaly, splenomegaly or mass.     Tenderness: There is no abdominal tenderness.  Musculoskeletal:     Right lower leg: No edema.     Left lower leg: No edema.  Lymphadenopathy:     Cervical: No cervical  adenopathy.     Right cervical: No superficial, deep or posterior cervical adenopathy.    Left cervical: No  superficial, deep or posterior cervical adenopathy.     Upper Body:     Right upper body: No supraclavicular or axillary adenopathy.     Left upper body: No supraclavicular or axillary adenopathy.     Lower Body: No right inguinal adenopathy. No left inguinal adenopathy.  Neurological:     General: No focal deficit present.     Mental Status: He is alert and oriented to person, place, and time.  Psychiatric:        Mood and Affect: Mood normal.        Behavior: Behavior normal.     LABS:      Latest Ref Rng & Units 03/15/2023    9:01 AM 09/08/2022    7:33 AM 03/09/2022    9:08 AM  CBC  WBC 4.0 - 10.5 K/uL 7.1  5.5  5.5   Hemoglobin 13.0 - 17.0 g/dL 14.6  14.3  14.9   Hematocrit 39.0 - 52.0 % 42.3  41.4  42.1   Platelets 150 - 400 K/uL 238  237  261       Latest Ref Rng & Units 03/15/2023    9:01 AM 09/08/2022    7:33 AM 03/09/2022    9:08 AM  CMP  Glucose 70 - 99 mg/dL 196  230  197   BUN 8 - 23 mg/dL 12  16  22    Creatinine 0.61 - 1.24 mg/dL 0.73  0.83  0.99   Sodium 135 - 145 mmol/L 134  137  136   Potassium 3.5 - 5.1 mmol/L 4.3  4.1  4.6   Chloride 98 - 111 mmol/L 102  103  103   CO2 22 - 32 mmol/L 21  24  24    Calcium 8.9 - 10.3 mg/dL 8.9  9.0  9.6   Total Protein 6.5 - 8.1 g/dL 7.5  7.2  7.9   Total Bilirubin 0.3 - 1.2 mg/dL 0.9  0.5  0.7   Alkaline Phos 38 - 126 U/L 90  74  87   AST 15 - 41 U/L 78  51  109   ALT 0 - 44 U/L 87  63  109      No results found for: "CEA1", "CEA" / No results found for: "CEA1", "CEA" No results found for: "PSA1" No results found for: "[id number]" No results found for: "CAN125"  No results found for: "TOTALPROTELP", "ALBUMINELP", "A1GS", "A2GS", "BETS", "BETA2SER", "GAMS", "MSPIKE", "SPEI" No results found for: "TIBC", "FERRITIN", "IRONPCTSAT" Lab Results  Component Value Date   LDH 184 03/15/2023     STUDIES:   No results found.

## 2023-03-15 NOTE — Patient Instructions (Addendum)
[facility]  Discharge Instructions  You were seen and examined today by Dr. Delton Coombes. Dr. Delton Coombes is a medical oncologist, meaning that he specializes in the treatment of cancer diagnoses. Dr. Delton Coombes discussed your past medical history, family history of cancers, and the events that led to you being here today.  You were referred to Dr. Delton Coombes for ongoing management of your lymphoma.   Dr. Delton Coombes will request labs for today.  Dr. Delton Coombes will see you back in 6 months with labs and a repeat CT scan.  Follow-up as scheduled.  Thank you for choosing [facility] to provide your oncology and hematology care.   To afford each patient quality time with our provider, please arrive at least 15 minutes before your scheduled appointment time. You may need to reschedule your appointment if you arrive late (10 or more minutes). Arriving late affects you and other patients whose appointments are after yours.  Also, if you miss three or more appointments without notifying the office, you may be dismissed from the clinic at the provider's discretion.    Again, thank you for choosing [facility].  Our hope is that these requests will decrease the amount of time that you wait before being seen by our physicians.   If you have a lab appointment with the [facility] please come in thru the Main Entrance and check in at the main information desk.           _____________________________________________________________  Should you have questions after your visit to [facility], please contact our office at [phone] and follow the prompts.  Our office hours are 8:00 a.m. to 4:30 p.m. Monday - Thursday and 8:00 a.m. to 2:30 p.m. Friday.  Please note that voicemails left after 4:00 p.m. may not be returned until the following business day.  We are closed weekends and all major holidays.  You do have access to  a nurse 24-7, just call the main number to the clinic [phone] and do not press any options, hold on the line and a nurse will answer the phone.    For prescription refill requests, have your pharmacy contact our office and allow 72 hours.    Masks are optional in the cancer centers. If you would like for your care team to wear a mask while they are taking care of you, please let them know. You may have one support person who is at least 62 years old accompany you for your appointments.

## 2023-03-16 ENCOUNTER — Other Ambulatory Visit: Payer: BC Managed Care – PPO

## 2023-03-16 ENCOUNTER — Telehealth: Payer: Self-pay | Admitting: *Deleted

## 2023-03-16 NOTE — Telephone Encounter (Signed)
Per Dr. Delton Coombes, patient made aware of essentially normal lab values with the exception of elevated LFT, which are improved from a year ago.  Will continue to monitor and follow up as planned.

## 2023-03-22 ENCOUNTER — Ambulatory Visit: Payer: BC Managed Care – PPO | Admitting: Hematology

## 2023-03-23 ENCOUNTER — Ambulatory Visit: Payer: BC Managed Care – PPO | Admitting: Oncology

## 2023-09-13 ENCOUNTER — Other Ambulatory Visit: Payer: Self-pay

## 2023-09-13 DIAGNOSIS — C8580 Other specified types of non-Hodgkin lymphoma, unspecified site: Secondary | ICD-10-CM

## 2023-09-13 NOTE — Progress Notes (Signed)
Lab orders entered

## 2023-09-14 ENCOUNTER — Inpatient Hospital Stay: Payer: BC Managed Care – PPO | Attending: Hematology

## 2023-09-14 ENCOUNTER — Ambulatory Visit (HOSPITAL_COMMUNITY)
Admission: RE | Admit: 2023-09-14 | Discharge: 2023-09-14 | Disposition: A | Discharge status: Home / Self Care | Payer: BC Managed Care – PPO | Source: Ambulatory Visit | Attending: Hematology | Admitting: Hematology

## 2023-09-14 DIAGNOSIS — C8300 Small cell B-cell lymphoma, unspecified site: Secondary | ICD-10-CM | POA: Insufficient documentation

## 2023-09-14 DIAGNOSIS — C8580 Other specified types of non-Hodgkin lymphoma, unspecified site: Secondary | ICD-10-CM | POA: Insufficient documentation

## 2023-09-14 DIAGNOSIS — Z79899 Other long term (current) drug therapy: Secondary | ICD-10-CM | POA: Diagnosis not present

## 2023-09-14 DIAGNOSIS — I700 Atherosclerosis of aorta: Secondary | ICD-10-CM | POA: Insufficient documentation

## 2023-09-14 DIAGNOSIS — Z905 Acquired absence of kidney: Secondary | ICD-10-CM | POA: Insufficient documentation

## 2023-09-14 LAB — CBC WITH DIFFERENTIAL/PLATELET
Abs Immature Granulocytes: 0.01 10*3/uL (ref 0.00–0.07)
Basophils Absolute: 0.1 10*3/uL (ref 0.0–0.1)
Basophils Relative: 1 %
Eosinophils Absolute: 0.2 10*3/uL (ref 0.0–0.5)
Eosinophils Relative: 3 %
HCT: 41.8 % (ref 39.0–52.0)
Hemoglobin: 14.1 g/dL (ref 13.0–17.0)
Immature Granulocytes: 0 %
Lymphocytes Relative: 30 %
Lymphs Abs: 1.8 10*3/uL (ref 0.7–4.0)
MCH: 28.7 pg (ref 26.0–34.0)
MCHC: 33.7 g/dL (ref 30.0–36.0)
MCV: 85.1 fL (ref 80.0–100.0)
Monocytes Absolute: 0.6 10*3/uL (ref 0.1–1.0)
Monocytes Relative: 9 %
Neutro Abs: 3.4 10*3/uL (ref 1.7–7.7)
Neutrophils Relative %: 57 %
Platelets: 234 10*3/uL (ref 150–400)
RBC: 4.91 MIL/uL (ref 4.22–5.81)
RDW: 12.6 % (ref 11.5–15.5)
WBC: 6 10*3/uL (ref 4.0–10.5)
nRBC: 0 % (ref 0.0–0.2)

## 2023-09-14 LAB — COMPREHENSIVE METABOLIC PANEL
ALT: 49 U/L — ABNORMAL HIGH (ref 0–44)
AST: 37 U/L (ref 15–41)
Albumin: 4.1 g/dL (ref 3.5–5.0)
Alkaline Phosphatase: 75 U/L (ref 38–126)
Anion gap: 9 (ref 5–15)
BUN: 15 mg/dL (ref 8–23)
CO2: 23 mmol/L (ref 22–32)
Calcium: 8.6 mg/dL — ABNORMAL LOW (ref 8.9–10.3)
Chloride: 105 mmol/L (ref 98–111)
Creatinine, Ser: 0.8 mg/dL (ref 0.61–1.24)
GFR, Estimated: >60 mL/min (ref >60)
Glucose, Bld: 146 mg/dL — ABNORMAL HIGH (ref 70–99)
Potassium: 3.5 mmol/L (ref 3.5–5.1)
Sodium: 137 mmol/L (ref 135–145)
Total Bilirubin: 0.7 mg/dL (ref 0.3–1.2)
Total Protein: 7.1 g/dL (ref 6.5–8.1)

## 2023-09-14 LAB — LACTATE DEHYDROGENASE: LDH: 139 U/L (ref 98–192)

## 2023-09-14 MED ORDER — IOHEXOL 300 MG/ML  SOLN
100.0000 mL | Freq: Once | INTRAMUSCULAR | Status: AC | PRN
  Administered 2023-09-14: 100 mL via INTRAVENOUS

## 2023-09-20 ENCOUNTER — Inpatient Hospital Stay: Payer: BC Managed Care – PPO | Attending: Hematology | Admitting: Hematology

## 2023-09-20 VITALS — BP 124/85 | HR 85 | Temp 98.4°F | Resp 16 | Wt 237.6 lb

## 2023-09-20 DIAGNOSIS — C8580 Other specified types of non-Hodgkin lymphoma, unspecified site: Secondary | ICD-10-CM | POA: Diagnosis not present

## 2023-09-20 DIAGNOSIS — N400 Enlarged prostate without lower urinary tract symptoms: Secondary | ICD-10-CM | POA: Insufficient documentation

## 2023-09-20 DIAGNOSIS — I700 Atherosclerosis of aorta: Secondary | ICD-10-CM | POA: Insufficient documentation

## 2023-09-20 DIAGNOSIS — K5730 Diverticulosis of large intestine without perforation or abscess without bleeding: Secondary | ICD-10-CM | POA: Diagnosis not present

## 2023-09-20 DIAGNOSIS — Z79899 Other long term (current) drug therapy: Secondary | ICD-10-CM | POA: Insufficient documentation

## 2023-09-20 DIAGNOSIS — Z905 Acquired absence of kidney: Secondary | ICD-10-CM | POA: Diagnosis not present

## 2023-09-20 DIAGNOSIS — C884 Extranodal marginal zone b-cell lymphoma of mucosa-associated lymphoid tissue (malt-lymphoma) not having achieved remission: Secondary | ICD-10-CM | POA: Insufficient documentation

## 2023-09-20 NOTE — Progress Notes (Signed)
[facility], [place] [address] [place] [address]   Clinic Day:  09/20/2023  Referring physician: Juliette Alcide, MD  Patient Care Team: Nicholas Alcide, MD as PCP - General (Family Medicine) Nicholas Massed, MD as Medical Oncologist (Medical Oncology)   ASSESSMENT & PLAN:   Assessment:  1.  Stage II marginal zone lymphoma: - Left perirenal mass needle biopsy (11/07/2016): Atypical lymphoid proliferation suspicious for lymphoma. - Robotic assisted laparoscopic excision of the left perinephric mass by Nicholas Pope on 01/08/2017 - Pathology: Non-Hodgkin's lymphoma, favor marginal zone type. - CT CAP (03/05/2020): Progression of soft tissue nodule in the right retroperitoneal space, grew from 1.8 cm to 2.7 cm. - XRT to the right perinephric region from 03/24/2020 through 04/14/2020, 28.8 Gray in 16 fractions  2.  Social/family history: - He is seen with his wife today.  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  Uses Roundup for many years.  Non-smoker. - No family history of malignancies.  Plan:  1.  Stage II marginal zone lymphoma: - He denies any B symptoms or infections. - Labs from 09/14/2023: Mildly elevated ALT stable.  Rest of the LFTs are normal.  LDH and CBC were normal.  Renal function is normal. - CT CAP (09/14/2023): Slight interval increase in size of the left perirenal soft tissue nodularity by 3 mm.  Stable prominent subcentimeter mediastinal and retroperitoneal lymph nodes.  No new lymphadenopathy. - No indication for treatment at this time.  Recommend for follow-up in 6 months with labs and exam.  Will consider repeat imaging in 1 year.   Orders Placed This Encounter  Procedures   CBC with Differential    Standing Status:   Future    Standing Expiration Date:   09/19/2024   Comprehensive metabolic panel    Standing Status:   Future    Standing Expiration Date:   09/19/2024   Lactate dehydrogenase    Standing  Status:   Future    Standing Expiration Date:   09/19/2024    Nicholas Pope,acting as a scribe for Nicholas Massed, MD.,have documented all relevant documentation on the behalf of Nicholas Massed, MD,as directed by  Nicholas Massed, MD while in the presence of Nicholas Massed, MD.  I, Nicholas Massed MD, have reviewed the above documentation for accuracy and completeness, and I agree with the above.    Nicholas Massed, MD   10/3/20245:33 PM  CHIEF COMPLAINT/PURPOSE OF CONSULT:   Diagnosis:  low-grade lymphoma diagnosed in 2017    Cancer Staging  Marginal zone lymphoma [facility]) Staging form: Hodgkin and Non-Hodgkin Lymphoma, AJCC 8th Edition - Clinical stage from 03/15/2023: Stage II (Marginal zone lymphoma) - Unsigned    Prior Therapy:  Status post needle biopsy done on 11/07/2016 that was not diagnostic robotic-assisted laparoscopic excision of a left perinephric mass done by Nicholas Pope on 01/08/2017- pathology confirmed the presence of marginal zone lymphoma  radiation therapy to intra-abdominal lymph node recurrence completed in April 2021. He received 16 fractions for a total of 28.8 Nicholas Pope   Current Therapy:  surveillance  HISTORY OF PRESENT ILLNESS:   Oncology History   No history exists.     Nicholas Pope is a 62 y.o. male presenting to clinic today for evaluation of  low-grade lymphoma as a transfer of care from Dr. Clelia Pope. He was last seen by Dr. Clelia Pope on 09/09/22.  Today, he states that he is doing well overall. His appetite level is at 100%. His energy  level is at 100%. He denies any new pains. He denies any fevers, chills, night sweats, weight loss, or recent infections.  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 He reports using PPI when spraying these chemicals. He has never been a smoker. Patient denies any known family history of cancers.  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  INTERVAL  HISTORY:   Nicholas Pope is a 62 y.o. male presenting to the clinic today for follow-up of low-grade lymphoma. He was last seen by me on 03/15/23 in consultation.   Since his last visit, he underwent CT C/A/P on 09/14/23 that found: slight interval increase in size of left perirenal soft tissue nodularity, which is compatible with worsening lymphoma; stable prominent subcentimeter mediastinal and retroperitoneal lymph nodes; no new pathologically enlarged or enlarging thoracic or abdominal/pelvic lymph nodes identified; diffuse bilateral bronchial wall thickening with overall improved scattered ground-glass opacities, likely infectious/inflammatory.  Today, he states that he is doing well overall. His appetite level is at 100%. His energy level is at 100%. He is accompanied by his wife.   He denies any fevers, night sweats, unexpected weight loss, infections, or new pains in the last 6 months.   PAST MEDICAL HISTORY:   Past Medical History: Past Medical History:  Diagnosis Date   Cancer (HCC) 2018   non-Hodgins lymphoma    Diabetes mellitus without complication (HCC)    GERD (gastroesophageal reflux disease)    History of bronchitis as a child    Pneumonia    hx of    PONV (postoperative nausea and vomiting)     Surgical History: Past Surgical History:  Procedure Laterality Date   COLONOSCOPY  05/2012   [place]   KNEE ARTHROPLASTY Left 1980's   torn cartilage    LYMPH NODE BIOPSY     POLYPECTOMY     ROBOTIC ASSITED PARTIAL NEPHRECTOMY Left 01/08/2017   Procedure: XI ROBOTIC ASSITED RESECTION OF PERINEPHRIC MASS;  Surgeon: Nicholas Purpura, MD;  Location: [facility];  Service: Urology;  Laterality: Left;   UMBILICAL HERNIA REPAIR     as a child    Social History: Social History   Socioeconomic History   Marital status: Married    Spouse name: Not on file   Number of children: Not on file   Years of education: Not on file   Highest education level: Not on file  Occupational  History   Not on file  Tobacco Use   Smoking status: Never   Smokeless tobacco: Former    Types: Chew    Quit date: 2018  Vaping Use   Vaping status: Never Used  Substance and Sexual Activity   Alcohol use: No   Drug use: No   Sexual activity: Not on file  Other Topics Concern   Not on file  Social History Narrative   Not on file   Social Determinants of Health   Financial Resource Strain: Not on file  Food Insecurity: No Food Insecurity (03/15/2023)   Hunger Vital Sign    Worried About Running Out of Food in the Last Year: Never true    Ran Out of Food in the Last Year: Never true  Transportation Needs: No Transportation Needs (03/15/2023)   PRAPARE - Administrator, Civil Service (Medical): No    Lack of Transportation (Non-Medical): No  Physical Activity: Not on file  Stress: Not on file  Social Connections: Not on file  Intimate Partner Violence: Not At Risk (03/15/2023)   Humiliation, Afraid, Rape, and Kick questionnaire    Fear of Current or Ex-Partner: No    Emotionally Abused: No    Physically Abused: No    Sexually Abused: No    Family History: Family History  Problem Relation Age of Onset   Colon cancer Neg Hx    Colon polyps Neg Hx    Esophageal cancer Neg Hx    Rectal cancer Neg Hx    Stomach cancer Neg Hx     Current Medications:  Current Outpatient Medications:    amLODipine (NORVASC) 10 MG tablet, Take 10 mg by mouth daily., Disp: , Rfl:    aspirin 325 MG tablet, Take 325 mg by mouth daily., Disp: , Rfl:    fluticasone (FLONASE) 50 MCG/ACT nasal spray, Place 2 sprays into both nostrils daily., Disp: , Rfl:    lisinopril (ZESTRIL) 40 MG tablet, Take 40 mg by mouth daily., Disp: , Rfl:    loratadine (CLARITIN) 10 MG tablet, Take 10 mg by mouth daily., Disp: , Rfl:    metFORMIN (GLUCOPHAGE) 500 MG tablet, , Disp: , Rfl:    Multiple Vitamin (MULTIVITAMIN WITH MINERALS) TABS tablet, Take 1 tablet by mouth at bedtime. Men's One-A-Day, Disp:  , Rfl:    omeprazole (PRILOSEC) 40 MG capsule, Take 40 mg by mouth at bedtime. , Disp: , Rfl:    Allergies: Allergies  Allergen Reactions   Niacin And Related     flushing    REVIEW OF SYSTEMS:   Review of Systems  Constitutional:  Negative for chills, fatigue and fever.  HENT:   Negative for lump/mass, mouth sores, nosebleeds, sore throat and trouble swallowing.   Eyes:  Negative for eye problems.  Respiratory:  Negative for cough and shortness of breath.   Cardiovascular:  Negative for chest pain, leg swelling and palpitations.  Gastrointestinal:  Positive for constipation. Negative for abdominal pain, diarrhea, nausea and vomiting.  Genitourinary:  Negative for bladder incontinence, difficulty urinating, dysuria, frequency, hematuria and nocturia.   Musculoskeletal:  Negative for arthralgias, back pain, flank pain, myalgias and neck pain.  Skin:  Negative for itching and rash.  Neurological:  Negative for dizziness, headaches and numbness.  Hematological:  Does not bruise/bleed easily.  Psychiatric/Behavioral:  Negative for depression, sleep disturbance and suicidal ideas. The patient is not nervous/anxious.   All other systems reviewed and are negative.    VITALS:   Blood pressure 124/85, pulse 85, temperature 98.4 F (36.9 C), temperature source Oral, resp. rate 16, weight 237 lb 9.6 oz (107.8 kg), SpO2 97%.  Wt Readings from Last 3 Encounters:  09/20/23 237 lb 9.6 oz (107.8 kg)  03/15/23 246 lb (111.6 kg)  09/15/22 243 lb 4.8 oz (110.4 kg)    Body mass index is 32.22 kg/m.  Performance status (ECOG): 0 - Asymptomatic  PHYSICAL EXAM:   Physical Exam Vitals and nursing note reviewed. Exam conducted with a chaperone present.  Constitutional:      Appearance: Normal appearance.  Cardiovascular:     Rate and Rhythm: Normal rate and regular rhythm.     Pulses: Normal pulses.     Heart sounds: Normal heart sounds.  Pulmonary:     Effort: Pulmonary effort is normal.      Breath sounds: Normal breath sounds.  Abdominal:     Palpations: Abdomen is soft. There is no hepatomegaly, splenomegaly or mass.     Tenderness: There is no abdominal tenderness.  Musculoskeletal:  Right lower leg: No edema.     Left lower leg: No edema.  Lymphadenopathy:     Cervical: No cervical adenopathy.     Right cervical: No superficial, deep or posterior cervical adenopathy.    Left cervical: No superficial, deep or posterior cervical adenopathy.     Upper Body:     Right upper body: No supraclavicular or axillary adenopathy.     Left upper body: No supraclavicular or axillary adenopathy.  Neurological:     General: No focal deficit present.     Mental Status: He is alert and oriented to person, place, and time.  Psychiatric:        Mood and Affect: Mood normal.        Behavior: Behavior normal.     LABS:      Latest Ref Rng & Units 09/14/2023    9:45 AM 03/15/2023    9:01 AM 09/08/2022    7:33 AM  CBC  WBC 4.0 - 10.5 K/uL 6.0  7.1  5.5   Hemoglobin 13.0 - 17.0 g/dL 19.1  47.8  29.5   Hematocrit 39.0 - 52.0 % 41.8  42.3  41.4   Platelets 150 - 400 K/uL 234  238  237       Latest Ref Rng & Units 09/14/2023    9:45 AM 03/15/2023    9:01 AM 09/08/2022    7:33 AM  CMP  Glucose 70 - 99 mg/dL 621  308  657   BUN 8 - 23 mg/dL 15  12  16    Creatinine 0.61 - 1.24 mg/dL 8.46  9.62  9.52   Sodium 135 - 145 mmol/L 137  134  137   Potassium 3.5 - 5.1 mmol/L 3.5  4.3  4.1   Chloride 98 - 111 mmol/L 105  102  103   CO2 22 - 32 mmol/L 23  21  24    Calcium 8.9 - 10.3 mg/dL 8.6  8.9  9.0   Total Protein 6.5 - 8.1 g/dL 7.1  7.5  7.2   Total Bilirubin 0.3 - 1.2 mg/dL 0.7  0.9  0.5   Alkaline Phos 38 - 126 U/L 75  90  74   AST 15 - 41 U/L 37  78  51   ALT 0 - 44 U/L 49  87  63      No results found for: "CEA1", "CEA" / No results found for: "CEA1", "CEA" No results found for: "PSA1" No results found for: "WUX324" No results found for: "CAN125"  No results found  for: "TOTALPROTELP", "ALBUMINELP", "A1GS", "A2GS", "BETS", "BETA2SER", "GAMS", "MSPIKE", "SPEI" No results found for: "TIBC", "FERRITIN", "IRONPCTSAT" Lab Results  Component Value Date   LDH 139 09/14/2023   LDH 184 03/15/2023     STUDIES:   CT CHEST ABDOMEN PELVIS W CONTRAST  Result Date: 09/14/2023 CLINICAL DATA:  Marginal zone lymphoma, non-Hodgkin's lymphoma history of left partial nephrectomy. Follow-up. EXAM: CT CHEST, ABDOMEN, AND PELVIS WITH CONTRAST TECHNIQUE: Multidetector CT imaging of the chest, abdomen and pelvis was performed following the standard protocol during bolus administration of intravenous contrast. RADIATION DOSE REDUCTION: This exam was performed according to the departmental dose-optimization program which includes automated exposure control, adjustment of the mA and/or kV according to patient size and/or use of iterative reconstruction technique. CONTRAST:  OMNIPAQUE IOHEXOL 300 MG/ML  SOLN COMPARISON:  Multiple priors including most recent CT September 08, 2022. FINDINGS: CT CHEST FINDINGS Cardiovascular: Normal caliber thoracic aorta. No central pulmonary embolus  on this nondedicated study. Normal size heart. No significant pericardial effusion/thickening. Mediastinum/Nodes: Stable prominent high right axillary lymph node measuring 9 mm on image 3/2. Stable prominent subcentimeter mediastinal lymph nodes. Previously indexed pretracheal lymph node measures 6 mm in short axis on image 17/2 previously 7 mm. No new pathologically enlarged or enlarging thoracic lymph nodes identified. Lungs/Pleura: Diffuse bilateral bronchial wall thickening. Fluctuating scattered ground-glass opacities have overall improved for instance the inferior right upper lobe ground-glass opacities have resolved and the anterior superior right upper lobe ground-glass opacity is stable on image 50/3. Stable benign solid nodule in the paramedian inferior right lower lobe measuring 11 mm on image  87/3. Musculoskeletal: No aggressive lytic or blastic lesion of bone. CT ABDOMEN PELVIS FINDINGS Hepatobiliary: No suspicious hepatic lesion. Gallbladder is unremarkable. No biliary ductal dilation. Pancreas: No pancreatic ductal dilation or evidence of acute inflammation. Spleen: No splenomegaly. Adrenals/Urinary Tract: Bilateral adrenal glands appear normal. Left perirenal soft tissue nodularity is slightly increased from prior examination. For reference: -Soft tissue along the inferior margin measures 3.6 x 1.4 cm on image 68/2 previously 3.3 x 1.3 cm -soft tissue nodule along the medial interpolar aspect of the kidney measures 18 x 12 mm on image 61/2 previously 15 x 11 mm. No hydronephrosis.  Kidneys demonstrate symmetric enhancement Urinary bladder is unremarkable for degree of distension. Stomach/Bowel: Stomach is within normal limits. Appendix appears normal. No evidence of bowel wall thickening, distention, or inflammatory changes. Scattered sigmoid colonic diverticulosis. Vascular/Lymphatic: Scattered aortic atherosclerosis. Smooth IVC contours. The portal, splenic and superior mesenteric veins are patent. Stable prominent retroperitoneal lymph nodes for instance a left periaortic lymph node at the level of the renal vein measures 9 mm on image 69/2, unchanged. Reproductive: Enlarged prostate gland with median lobe hypertrophy. Other: Large fat containing paraumbilical hernia is unchanged. Musculoskeletal: No aggressive lytic or blastic lesion of bone. Multilevel degenerative change of the spine. IMPRESSION: 1. Slight interval increase in size of left perirenal soft tissue nodularity, which is compatible with worsening lymphoma lymphoma. 2. Stable prominent subcentimeter mediastinal and retroperitoneal lymph nodes. No new pathologically enlarged or enlarging thoracic or abdominal/pelvic lymph nodes identified. 3. Diffuse bilateral bronchial wall thickening with overall improved scattered ground-glass  opacities, likely infectious/inflammatory. 4.  Aortic Atherosclerosis (ICD10-I70.0). Electronically Signed   By: Maudry Mayhew M.D.   On: 09/14/2023 13:12

## 2023-09-20 NOTE — Patient Instructions (Signed)
[facility] at [facility] Discharge Instructions   You were seen and examined today by Dr. Ellin Saba.  He reviewed the results of your lab work which are normal.   He reviewed the results of your CT scan. It is showing there is a slight increase in size in the lymph nodes surrounding your kidney. We will continue to monitor at this time since this is not affecting your blood counts and you are not having any symptoms. There is no need for any treatment at this time.   We will see you back in 6 months. We will repeat lab work prior to this visit.   Return as scheduled.    Thank you for choosing  [facility] Center at [facility] to provide your oncology and hematology care.  To afford each patient quality time with our provider, please arrive at least 15 minutes before your scheduled appointment time.   If you have a lab appointment with the Cancer Center please come in thru the Main Entrance and check in at the main information desk.  You need to re-schedule your appointment should you arrive 10 or more minutes late.  We strive to give you quality time with our providers, and arriving late affects you and other patients whose appointments are after yours.  Also, if you no show three or more times for appointments you may be dismissed from the clinic at the providers discretion.     Again, thank you for choosing [facility].  Our hope is that these requests will decrease the amount of time that you wait before being seen by our physicians.       _____________________________________________________________  Should you have questions after your visit to [facility], please contact our office at [phone] and follow the prompts.  Our office hours are 8:00 a.m. and 4:30 p.m. Monday - Friday.  Please note that voicemails left after 4:00 p.m. may not be returned until the following business day.  We are closed weekends and major  holidays.  You do have access to a nurse 24-7, just call the main number to the clinic [phone] and do not press any options, hold on the line and a nurse will answer the phone.    For prescription refill requests, have your pharmacy contact our office and allow 72 hours.    Due to Covid, you will need to wear a mask upon entering the hospital. If you do not have a mask, a mask will be given to you at the Main Entrance upon arrival. For doctor visits, patients may have 1 support person age 49 or older with them. For treatment visits, patients can not have anyone with them due to social distancing guidelines and our immunocompromised population.

## 2024-01-30 ENCOUNTER — Encounter: Payer: Self-pay | Admitting: Gastroenterology

## 2024-03-19 NOTE — Progress Notes (Signed)
 [facility] [address] [place] [address]   Clinic Day:  03/20/2024  Referring physician: Juliette Alcide, MD  Patient Care Team: Nicholas Alcide, MD as PCP - General (Family Medicine) Doreatha Massed, MD as Medical Oncologist (Medical Oncology)   ASSESSMENT & PLAN:   Assessment:  1.  Stage II marginal zone lymphoma: - Left perirenal mass needle biopsy (11/07/2016): Atypical lymphoid proliferation suspicious for lymphoma. - Robotic assisted laparoscopic excision of the left perinephric mass by Dr. Laverle Patter on 01/08/2017 - Pathology: Non-Hodgkin's lymphoma, favor marginal zone type. - CT CAP (03/05/2020): Progression of soft tissue nodule in the right retroperitoneal space, grew from 1.8 cm to 2.7 cm. - XRT to the right perinephric region from 03/24/2020 through 04/14/2020, 28.8 Gray in 16 fractions  2.  Social/family history: - He is seen with his wife today.  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  Uses [employer] for many years.  Non-smoker. - No family history of malignancies.  Plan:  1.  Stage II marginal zone lymphoma: - He denies any fevers, night sweats or weight loss or significant infections.  He had a GI infection 2 weeks ago with diarrhea and vomiting which subsided. - Previous CT CAP in September 2024 showed slight interval increase in size of the left perirenal soft tissue nodularity by 3 mm.  Stable prominent subcentimeter mediastinal and retroperitoneal lymph nodes with no new lymphadenopathy. - Physical exam: No palpable adenopathy or splenomegaly. - Labs today: Normal CBC.  LDH normal.  LFTs were normal.  Creatinine was 0.9. - Recommend follow-up in 6 months with repeat labs.  I will also repeat CT CAP with contrast.   Orders Placed This Encounter  Procedures   CT CHEST ABDOMEN PELVIS W CONTRAST    Standing Status:   Future    Expected Date:   09/19/2024    Expiration Date:   03/20/2025    If indicated for  the ordered procedure, I authorize the administration of contrast media per Radiology protocol:   Yes    Does the patient have a contrast media/X-ray dye allergy?:   No    Preferred imaging location?:   [facility]    Release to patient:   Immediate    If indicated for the ordered procedure, I authorize the administration of oral contrast media per Radiology protocol:   Yes   CBC with Differential    Standing Status:   Future    Expected Date:   09/15/2024    Expiration Date:   03/20/2025   Comprehensive metabolic panel    Standing Status:   Future    Expected Date:   09/15/2024    Expiration Date:   03/20/2025   Lactate dehydrogenase    Standing Status:   Future    Expected Date:   09/15/2024    Expiration Date:   03/20/2025    [name],acting as a scribe for Doreatha Massed, MD.,have documented all relevant documentation on the behalf of Doreatha Massed, MD,as directed by  Doreatha Massed, MD while in the presence of Doreatha Massed, MD.  I, Doreatha Massed MD, have reviewed the above documentation for accuracy and completeness, and I agree with the above.     Doreatha Massed, MD   4/3/20253:07 PM  CHIEF COMPLAINT/PURPOSE OF CONSULT:   Diagnosis:  low-grade lymphoma diagnosed in 2017    Cancer Staging  Marginal zone lymphoma [facility]) Staging form: Hodgkin and Non-Hodgkin Lymphoma, AJCC 8th Edition -  Clinical stage from 03/15/2023: Stage II (Marginal zone lymphoma) - Unsigned    Prior Therapy:  Status post needle biopsy done on 11/07/2016 that was not diagnostic robotic-assisted laparoscopic excision of a left perinephric mass done by Dr. Laverle Patter on 01/08/2017- pathology confirmed the presence of marginal zone lymphoma  radiation therapy to intra-abdominal lymph node recurrence completed in April 2021. He received 16 fractions for a total of 28.8 Wallace Cullens   Current Therapy:  surveillance  HISTORY OF PRESENT ILLNESS:   Oncology History   No  history exists.     Nicholas Pope is a 63 y.o. male presenting to clinic today for evaluation of  low-grade lymphoma as a transfer of care from Dr. Clelia Croft. He was last seen by Dr. Clelia Croft on 09/09/22.  Today, he states that he is doing well overall. His appetite level is at 100%. His energy level is at 100%. He denies any new pains. He denies any fevers, chills, night sweats, weight loss, or recent infections.  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 He reports using PPI when spraying these chemicals. He has never been a smoker. Patient denies any known family history of cancers.  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  INTERVAL HISTORY:   Nicholas Pope is a 63 y.o. male presenting to the clinic today for follow-up of low-grade lymphoma. He was last seen by me on 09/20/23.   Today, he states that he is doing well overall. His appetite level is at 100%. His energy level is at 80%.   PAST MEDICAL HISTORY:   Past Medical History: Past Medical History:  Diagnosis Date   Cancer (HCC) 2018   non-Hodgins lymphoma    Diabetes mellitus without complication (HCC)    GERD (gastroesophageal reflux disease)    History of bronchitis as a child    Pneumonia    hx of    PONV (postoperative nausea and vomiting)     Surgical History: Past Surgical History:  Procedure Laterality Date   COLONOSCOPY  05/2012   [place]   KNEE ARTHROPLASTY Left 1980's   torn cartilage    LYMPH NODE BIOPSY     POLYPECTOMY     ROBOTIC ASSITED PARTIAL NEPHRECTOMY Left 01/08/2017   Procedure: XI ROBOTIC ASSITED RESECTION OF PERINEPHRIC MASS;  Surgeon: Heloise Purpura, MD;  Location: [facility];  Service: Urology;  Laterality: Left;   UMBILICAL HERNIA REPAIR     as a child    Social History: Social History   Socioeconomic History   Marital status: Married    Spouse name: Not on file   Number of children: Not on file   Years of education: Not on file   Highest  education level: Not on file  Occupational History   Not on file  Tobacco Use   Smoking status: Never   Smokeless tobacco: Former    Types: Chew    Quit date: 2018  Vaping Use   Vaping status: Never Used  Substance and Sexual Activity   Alcohol use: No   Drug use: No   Sexual activity: Not on file  Other Topics Concern   Not on file  Social History Narrative   Not on file   Social Drivers of Health   Financial Resource Strain: Not on file  Food Insecurity: No Food Insecurity (03/15/2023)   Hunger Vital Sign    Worried About Running Out of Food in the Last Year: Never true  Ran Out of Food in the Last Year: Never true  Transportation Needs: No Transportation Needs (03/15/2023)   PRAPARE - Administrator, Civil Service (Medical): No    Lack of Transportation (Non-Medical): No  Physical Activity: Not on file  Stress: Not on file  Social Connections: Not on file  Intimate Partner Violence: Not At Risk (03/15/2023)   Humiliation, Afraid, Rape, and Kick questionnaire    Fear of Current or Ex-Partner: No    Emotionally Abused: No    Physically Abused: No    Sexually Abused: No    Family History: Family History  Problem Relation Age of Onset   Colon cancer Neg Hx    Colon polyps Neg Hx    Esophageal cancer Neg Hx    Rectal cancer Neg Hx    Stomach cancer Neg Hx     Current Medications:  Current Outpatient Medications:    amLODipine (NORVASC) 10 MG tablet, Take 10 mg by mouth daily., Disp: , Rfl:    aspirin 325 MG tablet, Take 325 mg by mouth daily., Disp: , Rfl:    fluticasone (FLONASE) 50 MCG/ACT nasal spray, Place 2 sprays into both nostrils daily., Disp: , Rfl:    lisinopril (ZESTRIL) 40 MG tablet, Take 40 mg by mouth daily., Disp: , Rfl:    loratadine (CLARITIN) 10 MG tablet, Take 10 mg by mouth daily., Disp: , Rfl:    metFORMIN (GLUCOPHAGE) 500 MG tablet, , Disp: , Rfl:    Multiple Vitamin (MULTIVITAMIN WITH MINERALS) TABS tablet, Take 1 tablet by  mouth at bedtime. Men's One-A-Day, Disp: , Rfl:    omeprazole (PRILOSEC) 40 MG capsule, Take 40 mg by mouth at bedtime. , Disp: , Rfl:    RYBELSUS 7 MG TABS, Take 1 tablet by mouth daily., Disp: , Rfl:    Allergies: Allergies  Allergen Reactions   Niacin And Related     flushing    REVIEW OF SYSTEMS:   Review of Systems  Constitutional:  Negative for chills, fatigue and fever.  HENT:   Negative for lump/mass, mouth sores, nosebleeds, sore throat and trouble swallowing.   Eyes:  Negative for eye problems.  Respiratory:  Negative for cough and shortness of breath.   Cardiovascular:  Negative for chest pain, leg swelling and palpitations.  Gastrointestinal:  Negative for abdominal pain, constipation, diarrhea, nausea and vomiting.  Genitourinary:  Negative for bladder incontinence, difficulty urinating, dysuria, frequency, hematuria and nocturia.   Musculoskeletal:  Negative for arthralgias, back pain, flank pain, myalgias and neck pain.  Skin:  Negative for itching and rash.  Neurological:  Negative for dizziness, headaches and numbness.  Hematological:  Does not bruise/bleed easily.  Psychiatric/Behavioral:  Negative for depression, sleep disturbance and suicidal ideas. The patient is not nervous/anxious.   All other systems reviewed and are negative.    VITALS:   Blood pressure (!) 133/93, pulse 84, temperature 97.9 F (36.6 C), temperature source Oral, resp. rate 18, weight 240 lb 15.4 oz (109.3 kg), SpO2 96%.  Wt Readings from Last 3 Encounters:  03/20/24 240 lb 15.4 oz (109.3 kg)  09/20/23 237 lb 9.6 oz (107.8 kg)  03/15/23 246 lb (111.6 kg)    Body mass index is 32.68 kg/m.  Performance status (ECOG): 0 - Asymptomatic  PHYSICAL EXAM:   Physical Exam Vitals and nursing note reviewed. Exam conducted with a chaperone present.  Constitutional:      Appearance: Normal appearance.  Cardiovascular:     Rate and Rhythm: Normal rate  and regular rhythm.     Pulses: Normal  pulses.     Heart sounds: Normal heart sounds.  Pulmonary:     Effort: Pulmonary effort is normal.     Breath sounds: Normal breath sounds.  Abdominal:     Palpations: Abdomen is soft. There is no hepatomegaly, splenomegaly or mass.     Tenderness: There is no abdominal tenderness.  Musculoskeletal:     Right lower leg: No edema.     Left lower leg: No edema.  Lymphadenopathy:     Cervical: No cervical adenopathy.     Right cervical: No superficial, deep or posterior cervical adenopathy.    Left cervical: No superficial, deep or posterior cervical adenopathy.     Upper Body:     Right upper body: No supraclavicular or axillary adenopathy.     Left upper body: No supraclavicular or axillary adenopathy.  Neurological:     General: No focal deficit present.     Mental Status: He is alert and oriented to person, place, and time.  Psychiatric:        Mood and Affect: Mood normal.        Behavior: Behavior normal.     LABS:      Latest Ref Rng & Units 03/20/2024    1:41 PM 09/14/2023    9:45 AM 03/15/2023    9:01 AM  CBC  WBC 4.0 - 10.5 K/uL 7.6  6.0  7.1   Hemoglobin 13.0 - 17.0 g/dL 40.9  81.1  91.4   Hematocrit 39.0 - 52.0 % 39.7  41.8  42.3   Platelets 150 - 400 K/uL 258  234  238       Latest Ref Rng & Units 03/20/2024    1:41 PM 09/14/2023    9:45 AM 03/15/2023    9:01 AM  CMP  Glucose 70 - 99 mg/dL 782  956  213   BUN 8 - 23 mg/dL 18  15  12    Creatinine 0.61 - 1.24 mg/dL 0.86  5.78  4.69   Sodium 135 - 145 mmol/L 135  137  134   Potassium 3.5 - 5.1 mmol/L 3.8  3.5  4.3   Chloride 98 - 111 mmol/L 102  105  102   CO2 22 - 32 mmol/L 22  23  21    Calcium 8.9 - 10.3 mg/dL 9.0  8.6  8.9   Total Protein 6.5 - 8.1 g/dL 7.4  7.1  7.5   Total Bilirubin 0.0 - 1.2 mg/dL 0.5  0.7  0.9   Alkaline Phos 38 - 126 U/L 76  75  90   AST 15 - 41 U/L 36  37  78   ALT 0 - 44 U/L 44  49  87      No results found for: "CEA1", "CEA" / No results found for: "CEA1", "CEA" No results  found for: "PSA1" No results found for: "GEX528" No results found for: "CAN125"  No results found for: "TOTALPROTELP", "ALBUMINELP", "A1GS", "A2GS", "BETS", "BETA2SER", "GAMS", "MSPIKE", "SPEI" No results found for: "TIBC", "FERRITIN", "IRONPCTSAT" Lab Results  Component Value Date   LDH 170 03/20/2024   LDH 139 09/14/2023   LDH 184 03/15/2023     STUDIES:   No results found.

## 2024-03-20 ENCOUNTER — Inpatient Hospital Stay: Payer: BC Managed Care – PPO | Attending: Hematology | Admitting: Hematology

## 2024-03-20 ENCOUNTER — Inpatient Hospital Stay: Payer: BC Managed Care – PPO

## 2024-03-20 VITALS — BP 133/93 | HR 84 | Temp 97.9°F | Resp 18 | Wt 241.0 lb

## 2024-03-20 DIAGNOSIS — C884 Extranodal marginal zone b-cell lymphoma of mucosa-associated lymphoid tissue (malt-lymphoma) not having achieved remission: Secondary | ICD-10-CM | POA: Insufficient documentation

## 2024-03-20 DIAGNOSIS — C8580 Other specified types of non-Hodgkin lymphoma, unspecified site: Secondary | ICD-10-CM

## 2024-03-20 DIAGNOSIS — Z79899 Other long term (current) drug therapy: Secondary | ICD-10-CM | POA: Insufficient documentation

## 2024-03-20 LAB — COMPREHENSIVE METABOLIC PANEL WITH GFR
ALT: 44 U/L (ref 0–44)
AST: 36 U/L (ref 15–41)
Albumin: 4.1 g/dL (ref 3.5–5.0)
Alkaline Phosphatase: 76 U/L (ref 38–126)
Anion gap: 11 (ref 5–15)
BUN: 18 mg/dL (ref 8–23)
CO2: 22 mmol/L (ref 22–32)
Calcium: 9 mg/dL (ref 8.9–10.3)
Chloride: 102 mmol/L (ref 98–111)
Creatinine, Ser: 0.9 mg/dL (ref 0.61–1.24)
GFR, Estimated: >60 mL/min (ref >60)
Glucose, Bld: 112 mg/dL — ABNORMAL HIGH (ref 70–99)
Potassium: 3.8 mmol/L (ref 3.5–5.1)
Sodium: 135 mmol/L (ref 135–145)
Total Bilirubin: 0.5 mg/dL (ref 0.0–1.2)
Total Protein: 7.4 g/dL (ref 6.5–8.1)

## 2024-03-20 LAB — CBC WITH DIFFERENTIAL/PLATELET
Abs Immature Granulocytes: 0.03 10*3/uL (ref 0.00–0.07)
Basophils Absolute: 0.1 10*3/uL (ref 0.0–0.1)
Basophils Relative: 1 %
Eosinophils Absolute: 0.2 10*3/uL (ref 0.0–0.5)
Eosinophils Relative: 2 %
HCT: 39.7 % (ref 39.0–52.0)
Hemoglobin: 13.8 g/dL (ref 13.0–17.0)
Immature Granulocytes: 0 %
Lymphocytes Relative: 26 %
Lymphs Abs: 2 10*3/uL (ref 0.7–4.0)
MCH: 29.2 pg (ref 26.0–34.0)
MCHC: 34.8 g/dL (ref 30.0–36.0)
MCV: 83.9 fL (ref 80.0–100.0)
Monocytes Absolute: 0.7 10*3/uL (ref 0.1–1.0)
Monocytes Relative: 10 %
Neutro Abs: 4.7 10*3/uL (ref 1.7–7.7)
Neutrophils Relative %: 61 %
Platelets: 258 10*3/uL (ref 150–400)
RBC: 4.73 MIL/uL (ref 4.22–5.81)
RDW: 12.8 % (ref 11.5–15.5)
WBC: 7.6 10*3/uL (ref 4.0–10.5)
nRBC: 0 % (ref 0.0–0.2)

## 2024-03-20 LAB — LACTATE DEHYDROGENASE: LDH: 170 U/L (ref 98–192)

## 2024-03-20 NOTE — Patient Instructions (Signed)
[facility] at [facility] Discharge Instructions   You were seen and examined today by Dr. Ellin Saba.  He reviewed the results of your lab work which are normal/stable.   We will see you back in 6 months. We will repeat lab work at this visit.   Return as scheduled.    Thank you for choosing [facility] at [facility] to provide your oncology and hematology care.  To afford each patient quality time with our provider, please arrive at least 15 minutes before your scheduled appointment time.   If you have a lab appointment with the Cancer Center please come in thru the Main Entrance and check in at the main information desk.  You need to re-schedule your appointment should you arrive 10 or more minutes late.  We strive to give you quality time with our providers, and arriving late affects you and other patients whose appointments are after yours.  Also, if you no show three or more times for appointments you may be dismissed from the clinic at the providers discretion.     Again, thank you for choosing [facility].  Our hope is that these requests will decrease the amount of time that you wait before being seen by our physicians.       _____________________________________________________________  Should you have questions after your visit to [facility], please contact our office at [phone] and follow the prompts.  Our office hours are 8:00 a.m. and 4:30 p.m. Monday - Friday.  Please note that voicemails left after 4:00 p.m. may not be returned until the following business day.  We are closed weekends and major holidays.  You do have access to a nurse 24-7, just call the main number to the clinic [phone] and do not press any options, hold on the line and a nurse will answer the phone.    For prescription refill requests, have your pharmacy contact our office and allow 72 hours.    Due to Covid, you will need  to wear a mask upon entering the hospital. If you do not have a mask, a mask will be given to you at the Main Entrance upon arrival. For doctor visits, patients may have 1 support person age 69 or older with them. For treatment visits, patients can not have anyone with them due to social distancing guidelines and our immunocompromised population.

## 2024-09-22 ENCOUNTER — Inpatient Hospital Stay: Attending: Physician Assistant

## 2024-09-22 ENCOUNTER — Ambulatory Visit (HOSPITAL_COMMUNITY)
Admission: RE | Admit: 2024-09-22 | Discharge: 2024-09-22 | Disposition: A | Discharge status: Home / Self Care | Source: Ambulatory Visit | Attending: Hematology | Admitting: Hematology

## 2024-09-22 DIAGNOSIS — C8580 Other specified types of non-Hodgkin lymphoma, unspecified site: Secondary | ICD-10-CM | POA: Insufficient documentation

## 2024-09-22 DIAGNOSIS — Z87891 Personal history of nicotine dependence: Secondary | ICD-10-CM | POA: Diagnosis not present

## 2024-09-22 DIAGNOSIS — Z7982 Long term (current) use of aspirin: Secondary | ICD-10-CM | POA: Insufficient documentation

## 2024-09-22 DIAGNOSIS — K439 Ventral hernia without obstruction or gangrene: Secondary | ICD-10-CM | POA: Diagnosis not present

## 2024-09-22 DIAGNOSIS — Z905 Acquired absence of kidney: Secondary | ICD-10-CM | POA: Insufficient documentation

## 2024-09-22 DIAGNOSIS — E669 Obesity, unspecified: Secondary | ICD-10-CM | POA: Diagnosis not present

## 2024-09-22 DIAGNOSIS — M47816 Spondylosis without myelopathy or radiculopathy, lumbar region: Secondary | ICD-10-CM | POA: Diagnosis not present

## 2024-09-22 DIAGNOSIS — R911 Solitary pulmonary nodule: Secondary | ICD-10-CM | POA: Diagnosis present

## 2024-09-22 DIAGNOSIS — C884 Extranodal marginal zone b-cell lymphoma of mucosa-associated lymphoid tissue (malt-lymphoma) not having achieved remission: Secondary | ICD-10-CM | POA: Diagnosis present

## 2024-09-22 DIAGNOSIS — Z8701 Personal history of pneumonia (recurrent): Secondary | ICD-10-CM | POA: Diagnosis not present

## 2024-09-22 DIAGNOSIS — K760 Fatty (change of) liver, not elsewhere classified: Secondary | ICD-10-CM | POA: Diagnosis not present

## 2024-09-22 DIAGNOSIS — N400 Enlarged prostate without lower urinary tract symptoms: Secondary | ICD-10-CM | POA: Insufficient documentation

## 2024-09-22 DIAGNOSIS — Z79899 Other long term (current) drug therapy: Secondary | ICD-10-CM | POA: Insufficient documentation

## 2024-09-22 LAB — CBC WITH DIFFERENTIAL/PLATELET
Abs Immature Granulocytes: 0.03 K/uL (ref 0.00–0.07)
Basophils Absolute: 0.1 K/uL (ref 0.0–0.1)
Basophils Relative: 1 %
Eosinophils Absolute: 0.2 K/uL (ref 0.0–0.5)
Eosinophils Relative: 3 %
HCT: 42.1 % (ref 39.0–52.0)
Hemoglobin: 14.5 g/dL (ref 13.0–17.0)
Immature Granulocytes: 1 %
Lymphocytes Relative: 29 %
Lymphs Abs: 1.8 K/uL (ref 0.7–4.0)
MCH: 28.9 pg (ref 26.0–34.0)
MCHC: 34.4 g/dL (ref 30.0–36.0)
MCV: 84 fL (ref 80.0–100.0)
Monocytes Absolute: 0.7 K/uL (ref 0.1–1.0)
Monocytes Relative: 11 %
Neutro Abs: 3.6 K/uL (ref 1.7–7.7)
Neutrophils Relative %: 55 %
Platelets: 221 K/uL (ref 150–400)
RBC: 5.01 MIL/uL (ref 4.22–5.81)
RDW: 12.2 % (ref 11.5–15.5)
WBC: 6.3 K/uL (ref 4.0–10.5)
nRBC: 0 % (ref 0.0–0.2)

## 2024-09-22 LAB — COMPREHENSIVE METABOLIC PANEL WITH GFR
ALT: 45 U/L — ABNORMAL HIGH (ref 0–44)
AST: 30 U/L (ref 15–41)
Albumin: 4.5 g/dL (ref 3.5–5.0)
Alkaline Phosphatase: 82 U/L (ref 38–126)
Anion gap: 11 (ref 5–15)
BUN: 17 mg/dL (ref 8–23)
CO2: 24 mmol/L (ref 22–32)
Calcium: 9.1 mg/dL (ref 8.9–10.3)
Chloride: 102 mmol/L (ref 98–111)
Creatinine, Ser: 0.78 mg/dL (ref 0.61–1.24)
GFR, Estimated: >60 mL/min (ref >60)
Glucose, Bld: 134 mg/dL — ABNORMAL HIGH (ref 70–99)
Potassium: 4 mmol/L (ref 3.5–5.1)
Sodium: 137 mmol/L (ref 135–145)
Total Bilirubin: 0.3 mg/dL (ref 0.0–1.2)
Total Protein: 7.2 g/dL (ref 6.5–8.1)

## 2024-09-22 LAB — LACTATE DEHYDROGENASE: LDH: 185 U/L (ref 98–192)

## 2024-09-22 MED ORDER — IOHEXOL 300 MG/ML  SOLN
100.0000 mL | Freq: Once | INTRAMUSCULAR | Status: AC | PRN
  Administered 2024-09-22: 100 mL via INTRAVENOUS

## 2024-09-27 NOTE — Progress Notes (Unsigned)
 [facility] [address], [place] [address]   CLINIC:  Medical Oncology/Hematology  PCP:  Nicholas Elspeth BRAVO, MD [address] 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 Renda on 01/08/2017- pathology confirmed the presence of marginal zone lymphoma  radiation therapy to intra-abdominal lymph node recurrence completed in April 2021. He received 16 fractions for a total of 28.8 Gray   CURRENT THERAPY: Surveillance  BRIEF ONCOLOGIC HISTORY:  CANCER STAGING: Cancer Staging  Marginal zone lymphoma [facility]) Staging form: Hodgkin and Non-Hodgkin Lymphoma, AJCC 8th Edition - Clinical stage from 03/15/2023: Stage II (Marginal zone lymphoma) - Unsigned   INTERVAL HISTORY:   [name], a 63 y.o. male, returns for routine follow-up of his stage II marginal zone lymphoma. Nicholas Pope was last seen on 03/20/2024 by Dr. Rogers.   At today's visit, he  reports feeling ***.   ***He denies any recent hospitalizations, surgeries, or changes in his  baseline health status. He reports ***% energy and ***% appetite.   ***He is maintaining stable weight at this time. ***He denies any fevers, night sweats or weight loss or significant infections. ***He denies noticing any palpable lymphadenopathy or masses.   ASSESSMENT & PLAN:  1.  Stage II marginal zone lymphoma: - Left perirenal mass needle biopsy (11/07/2016): Atypical lymphoid proliferation suspicious for lymphoma. - Robotic assisted laparoscopic excision of the left perinephric mass by Dr. Renda on 01/08/2017 - Pathology: Non-Hodgkin's lymphoma, favor marginal zone type. - CT CAP (03/05/2020): Progression of soft tissue nodule in the right retroperitoneal space, grew from 1.8 cm to 2.7 cm. - XRT  to the right perinephric region from 03/24/2020 through 04/14/2020, 28.8 Gray in 16 fractions - CT CAP in September 2024 showed slight interval increase in size of the left perirenal soft tissue nodularity by 3 mm.  Stable prominent subcentimeter mediastinal and retroperitoneal lymph nodes with no new lymphadenopathy. - Most recent CT CAP (09/22/2025): Increased size of paraesophageal lymph node, now measuring 9 mm *** Slightly increased size of left retroperitoneal soft tissue nodularity  Perirenal nodule measuring 2.0 x 1.2 cm, previously 1.8 x 1.2 cm Dominant soft tissue nodule superior and posterior to the adrenal gland measures 2.9 x 1.4 cm (previously 1.8 x 1.1 cm) Similar left juxtacortical renal soft tissue (3.6 x 1.3 cm, previously 3.6 x 1.4 cm) Stable subcentimeter retroperitoneal lymph nodes New 3 mm solid left upper lobe pulmonary nodule, nonspecific, suggest attention on follow-up imaging (Other findings include hepatic steatosis and enlarged prostate gland) - He denies any fevers, night sweats or weight loss or significant infections.  *** - Physical exam (***): No palpable adenopathy or splenomegaly. - Labs (09/22/2024): Normal CBC/D with normal differential.  Normal LDH.  CMP grossly normal, except for minimally elevated ALT at 45. - PLAN: *** Patient has had approximately 1 cm increase in dominant soft tissue nodule superior and posterior to the adrenal gland, now measuring 2.9 x 1.4 cm (previously 1.8 x 1.1 cm).  Per discussion with Dr. Davonna, she has recommended repeat PET scan for further characterization of left retroperitoneal soft tissue nodularity.  MD visit to follow PET scan.  ***  2.  Social/family history: - He is seen with his wife today.  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  Uses [employer] for many years.  Non-smoker. - No family history of malignancies.  PLAN SUMMARY: >> *** >> *** >> ***   REVIEW OF SYSTEMS: ***  Review of  Systems - Oncology  PHYSICAL EXAM:   Performance status (ECOG): {CHL ONC ED:8845999799} *** There were no vitals filed for this visit. Wt Readings from Last 3 Encounters:  03/20/24 240 lb 15.4 oz (109.3 kg)  09/20/23 237 lb 9.6 oz (107.8 kg)  03/15/23 246 lb (111.6 kg)   Physical Exam   PAST MEDICAL/SURGICAL HISTORY:  Past Medical History:  Diagnosis Date   Cancer (HCC) 2018   non-Hodgins lymphoma    Diabetes mellitus without complication (HCC)    GERD (gastroesophageal reflux disease)    History of bronchitis as a child    Pneumonia    hx of    PONV (postoperative nausea and vomiting)    Past Surgical History:  Procedure Laterality Date   COLONOSCOPY  05/2012   [place],   KNEE ARTHROPLASTY Left 1980's   torn cartilage    LYMPH NODE BIOPSY     POLYPECTOMY     ROBOTIC ASSITED PARTIAL NEPHRECTOMY Left 01/08/2017   Procedure: XI ROBOTIC ASSITED RESECTION OF PERINEPHRIC MASS;  Surgeon: Gretel Ferrara, MD;  Location: [facility];  Service: Urology;  Laterality: Left;   UMBILICAL HERNIA REPAIR     as a child    SOCIAL HISTORY:  Social History   Socioeconomic History   Marital status: Married    Spouse name: Not on file   Number of children: Not on file   Years of education: Not on file   Highest education level: Not on file  Occupational History   Not on file  Tobacco Use   Smoking status: Never   Smokeless tobacco: Former    Types: Chew    Quit date: 2018  Vaping Use   Vaping status: Never Used  Substance and Sexual Activity   Alcohol use: No   Drug use: No   Sexual activity: Not on file  Other Topics Concern   Not on file  Social History Narrative   Not on file   Social Drivers of Health   Financial Resource Strain: Not on file  Food Insecurity: No Food Insecurity (03/15/2023)   Hunger Vital Sign    Worried About Running Out of Food in the Last Year: Never true    Ran Out of Food in the Last Year: Never true  Transportation Needs: No Transportation Needs  (03/15/2023)   PRAPARE - Administrator, Civil Service (Medical): No    Lack of Transportation (Non-Medical): No  Physical Activity: Not on file  Stress: Not on file  Social Connections: Not on file  Intimate Partner Violence: Not At Risk (03/15/2023)   Humiliation, Afraid, Rape, and Kick questionnaire    Fear of Current or Ex-Partner: No    Emotionally Abused: No    Physically Abused: No    Sexually Abused: No    FAMILY HISTORY:  Family History  Problem Relation Age of Onset   Colon cancer Neg Hx    Colon polyps Neg Hx    Esophageal cancer Neg Hx    Rectal cancer Neg Hx    Stomach cancer Neg Hx     CURRENT MEDICATIONS:  Current Outpatient Medications  Medication Sig Dispense Refill   amLODipine (NORVASC) 10 MG tablet Take 10 mg by mouth daily.     aspirin 325 MG tablet Take 325 mg by mouth  daily.     fluticasone  (FLONASE ) 50 MCG/ACT nasal spray Place 2 sprays into both nostrils daily.     lisinopril (ZESTRIL) 40 MG tablet Take 40 mg by mouth daily.     loratadine  (CLARITIN ) 10 MG tablet Take 10 mg by mouth daily.     metFORMIN (GLUCOPHAGE) 500 MG tablet      Multiple Vitamin (MULTIVITAMIN WITH MINERALS) TABS tablet Take 1 tablet by mouth at bedtime. Men's One-A-Day     omeprazole (PRILOSEC) 40 MG capsule Take 40 mg by mouth at bedtime.      RYBELSUS 7 MG TABS Take 1 tablet by mouth daily.     No current facility-administered medications for this visit.    ALLERGIES:  Allergies  Allergen Reactions   Niacin And Related     flushing    LABORATORY DATA:  I have reviewed the labs as listed.     Latest Ref Rng & Units 09/22/2024    7:58 AM 03/20/2024    1:41 PM 09/14/2023    9:45 AM  CBC  WBC 4.0 - 10.5 K/uL 6.3  7.6  6.0   Hemoglobin 13.0 - 17.0 g/dL 85.4  86.1  85.8   Hematocrit 39.0 - 52.0 % 42.1  39.7  41.8   Platelets 150 - 400 K/uL 221  258  234       Latest Ref Rng & Units 09/22/2024    7:58 AM 03/20/2024    1:41 PM 09/14/2023    9:45 AM  CMP   Glucose 70 - 99 mg/dL 865  887  853   BUN 8 - 23 mg/dL 17  18  15    Creatinine 0.61 - 1.24 mg/dL 9.21  9.09  9.19   Sodium 135 - 145 mmol/L 137  135  137   Potassium 3.5 - 5.1 mmol/L 4.0  3.8  3.5   Chloride 98 - 111 mmol/L 102  102  105   CO2 22 - 32 mmol/L 24  22  23    Calcium 8.9 - 10.3 mg/dL 9.1  9.0  8.6   Total Protein 6.5 - 8.1 g/dL 7.2  7.4  7.1   Total Bilirubin 0.0 - 1.2 mg/dL 0.3  0.5  0.7   Alkaline Phos 38 - 126 U/L 82  76  75   AST 15 - 41 U/L 30  36  37   ALT 0 - 44 U/L 45  44  49     DIAGNOSTIC IMAGING:  I have independently reviewed the scans and discussed with the patient. CT CHEST ABDOMEN PELVIS W CONTRAST Result Date: 09/22/2024 CLINICAL DATA:  Hematologic malignancy, monitor. Staging non-Hodgkin's lymphoma. * Tracking Code: BO * EXAM: CT CHEST, ABDOMEN, AND PELVIS WITH CONTRAST TECHNIQUE: Multidetector CT imaging of the chest, abdomen and pelvis was performed following the standard protocol during bolus administration of intravenous contrast. RADIATION DOSE REDUCTION: This exam was performed according to the departmental dose-optimization program which includes automated exposure control, adjustment of the mA and/or kV according to patient size and/or use of iterative reconstruction technique. CONTRAST:  OMNIPAQUE  IOHEXOL  300 MG/ML  SOLN COMPARISON:  Multiple priors including CT September 14, 2023. FINDINGS: CT CHEST FINDINGS Cardiovascular: Normal caliber thoracic aorta. Normal size heart. No significant pericardial effusion/thickening. Mediastinum/Nodes: Previously indexed high right axillary lymph node is outside the field of view on today's examination. Increased size of a paraesophageal lymph node now measuring 9 mm in short axis on image 22/2. Additional prominent subcentimeter mediastinal lymph nodes are stable, the  previously indexed pretracheal lymph node measures 6 mm in short axis on image 15/2, unchanged. Gas fluid levels in a patulous esophagus.  Lungs/Pleura: New 3 mm solid left upper lobe pulmonary nodule on image 67/3. Stable left lower lobe pulmonary nodule measuring 10 mm on image 85/3 previously 11 mm. Musculoskeletal: Stable sclerosis in the lateral left seventh rib. Healed remote left-sided rib fractures. No new aggressive lytic or blastic lesion of bone. CT ABDOMEN PELVIS FINDINGS Hepatobiliary: Diffuse hepatic steatosis. No suspicious hepatic lesion. Gallbladder is unremarkable.  No biliary ductal dilation. Pancreas: No pancreatic ductal dilation or evidence of acute inflammation. Spleen: No splenomegaly or focal splenic lesion. Adrenals/Urinary Tract: No suspicious adrenal nodule/mass. No hydronephrosis.  Kidneys demonstrate symmetric enhancement. Left retroperitoneal soft tissue nodularity is slightly increased from prior. For reference: -previously index perirenal nodule measures 2.0 x 1.2 cm on image 61/2 previously 1.8 x 1.2 cm -dominant soft tissue nodule superior and posterior to the adrenal gland measures 2.9 x 1.4 cm on image 52/2 previously 1 8 x 1.1 cm. Juxtacortical soft tissue measures 3.6 x 1.3 cm on image 68/2 previously 3.6 x 1.4 cm. Urinary bladder is unremarkable for degree of distension. Stomach/Bowel: No evidence of bowel obstruction. Stomach is minimally distended. Noninflamed appendix. Vascular/Lymphatic: Normal caliber abdominal aorta. Smooth IVC contours. Stable prominent retroperitoneal lymph nodes for instance a left periaortic lymph node at the level of the renal veins measures 9 mm in short axis on image 67/2 previously 9 mm. Reproductive: Enlarged prostate gland. Other: Fat containing ventral hernia. Musculoskeletal: No aggressive lytic or blastic lesion of bone. Lumbar spondylosis. IMPRESSION: 1. Increased size of a paraesophageal lymph node now measuring 9 mm in short axis. 2. Slightly increased size of left retroperitoneal soft tissue nodularity. 3. Similar left juxta cortical renal soft tissue. 4. Stable  subcentimeter retroperitoneal lymph nodes. 5. New 3 mm solid left upper lobe pulmonary nodule, nonspecific suggest attention on follow-up imaging. 6. Diffuse hepatic steatosis. 7. Enlarged prostate gland. Electronically Signed   By: Reyes Holder M.D.   On: 09/22/2024 14:06     WRAP UP:  All questions were answered. The patient knows to call the clinic with any problems, questions or concerns.  Medical decision making: ***  Time spent on visit: I spent {CHL ONC TIME VISIT - DTPQU:[id number]} counseling the patient face to face. The total time spent in the appointment was {CHL ONC TIME VISIT - DTPQU:[id number]} and more than 50% was on counseling.  Pleasant CHRISTELLA Barefoot, PA-C  ***

## 2024-09-29 ENCOUNTER — Inpatient Hospital Stay: Admitting: Physician Assistant

## 2024-09-29 VITALS — BP 137/81 | HR 74 | Temp 98.1°F | Resp 18 | Wt 239.0 lb

## 2024-09-29 DIAGNOSIS — C884 Extranodal marginal zone b-cell lymphoma of mucosa-associated lymphoid tissue (malt-lymphoma) not having achieved remission: Secondary | ICD-10-CM | POA: Diagnosis not present

## 2024-09-29 DIAGNOSIS — C8580 Other specified types of non-Hodgkin lymphoma, unspecified site: Secondary | ICD-10-CM | POA: Diagnosis not present

## 2024-09-29 NOTE — Patient Instructions (Signed)
 [facility] at [facility] **VISIT SUMMARY & IMPORTANT INSTRUCTIONS **   You were seen today by Pleasant Barefoot PA-C for your history of lymphoma.   Your CT scan showed some enlargement in the nodule near your left kidney. We will check a PET scan for a better look at this nodule. You will see Dr. Davonna 1 week after your PET scan to discuss results and next steps.  ** Thank you for trusting me with your healthcare!  I strive to provide all of my patients with quality care at each visit.  If you receive a survey for this visit, I would be so grateful to you for taking the time to provide feedback.  Thank you in advance!  ~ Aja Whitehair                                        Dr. Mickiel Davonna Pleasant Barefoot, PA-C          Delon Hope, NP   - - - - - - - - - - - - - - - - - -    Thank you for choosing [facility] at [facility] to provide your oncology and hematology care.  To afford each patient quality time with our provider, please arrive at least 15 minutes before your scheduled appointment time.   If you have a lab appointment with the Cancer Center please come in thru the Main Entrance and check in at the main information desk.  You need to re-schedule your appointment should you arrive 10 or more minutes late.  We strive to give you quality time with our providers, and arriving late affects you and other patients whose appointments are after yours.  Also, if you no show three or more times for appointments you may be dismissed from the clinic at the providers discretion.     Again, thank you for choosing [facility].  Our hope is that these requests will decrease the amount of time that you wait before being seen by our physicians.       _____________________________________________________________  Should you have questions after your visit to [facility], please contact our office at [phone] and  follow the prompts.  Our office hours are 8:00 a.m. and 4:30 p.m. Monday - Friday.  Please note that voicemails left after 4:00 p.m. may not be returned until the following business day.  We are closed weekends and major holidays.  You do have access to a nurse 24-7, just call the main number to the clinic [phone] and do not press any options, hold on the line and a nurse will answer the phone.    For prescription refill requests, have your pharmacy contact our office and allow 72 hours.

## 2024-10-09 ENCOUNTER — Encounter (HOSPITAL_COMMUNITY)
Admission: RE | Admit: 2024-10-09 | Discharge: 2024-10-09 | Disposition: A | Discharge status: Home / Self Care | Source: Ambulatory Visit | Attending: Physician Assistant | Admitting: Physician Assistant

## 2024-10-09 DIAGNOSIS — C8580 Other specified types of non-Hodgkin lymphoma, unspecified site: Secondary | ICD-10-CM | POA: Insufficient documentation

## 2024-10-09 MED ORDER — FLUDEOXYGLUCOSE F - 18 (FDG) INJECTION
11.9300 | Freq: Once | INTRAVENOUS | Status: AC | PRN
  Administered 2024-10-09: 11.93 via INTRAVENOUS

## 2024-10-16 ENCOUNTER — Inpatient Hospital Stay (HOSPITAL_BASED_OUTPATIENT_CLINIC_OR_DEPARTMENT_OTHER): Admitting: Oncology

## 2024-10-16 VITALS — BP 130/83 | HR 86 | Temp 97.7°F | Resp 16 | Wt 240.5 lb

## 2024-10-16 DIAGNOSIS — C8580 Other specified types of non-Hodgkin lymphoma, unspecified site: Secondary | ICD-10-CM

## 2024-10-16 DIAGNOSIS — C884 Extranodal marginal zone b-cell lymphoma of mucosa-associated lymphoid tissue (malt-lymphoma) not having achieved remission: Secondary | ICD-10-CM | POA: Diagnosis not present

## 2024-10-16 NOTE — Patient Instructions (Signed)
 [facility] at [facility] Discharge Instructions   You were seen and examined today by Dr. Davonna.  She reviewed the results of your PET scan. It is showing a new area of activity in the area behind your right collar bone. Dr. Davonna would like to have this biopsied.    We will refer you to .   Return as scheduled.    Thank you for choosing [facility] at [facility] to provide your oncology and hematology care.  To afford each patient quality time with our provider, please arrive at least 15 minutes before your scheduled appointment time.   If you have a lab appointment with the Cancer Center please come in thru the Main Entrance and check in at the main information desk.  You need to re-schedule your appointment should you arrive 10 or more minutes late.  We strive to give you quality time with our providers, and arriving late affects you and other patients whose appointments are after yours.  Also, if you no show three or more times for appointments you may be dismissed from the clinic at the providers discretion.     Again, thank you for choosing [facility].  Our hope is that these requests will decrease the amount of time that you wait before being seen by our physicians.       _____________________________________________________________  Should you have questions after your visit to [facility], please contact our office at [phone] and follow the prompts.  Our office hours are 8:00 a.m. and 4:30 p.m. Monday - Friday.  Please note that voicemails left after 4:00 p.m. may not be returned until the following business day.  We are closed weekends and major holidays.  You do have access to a nurse 24-7, just call the main number to the clinic [phone] and do not press any options, hold on the line and a nurse will answer the phone.    For prescription refill requests, have your pharmacy contact our office  and allow 72 hours.    Due to Covid, you will need to wear a mask upon entering the hospital. If you do not have a mask, a mask will be given to you at the Main Entrance upon arrival. For doctor visits, patients may have 1 support person age 73 or older with them. For treatment visits, patients can not have anyone with them due to social distancing guidelines and our immunocompromised population.

## 2024-10-16 NOTE — Progress Notes (Signed)
 Patient Care Team: Nicholas Elspeth BRAVO, MD as PCP - General (Family Medicine)  Clinic Day:  10/16/2024  Referring physician: Lari Elspeth BRAVO, MD   CHIEF COMPLAINT:  CC: Stage II marginal zone lymphoma   Nicholas Pope 63 y.o. male was transferred to my care after his prior physician has left.   ASSESSMENT & PLAN:   Assessment & Plan: Nicholas Pope  is a 64 y.o. male with marginal zone lymphoma  Marginal Zone lymphoma Marginal zone lymphoma initially diagnosed in left perinephric space in 2017 s/p resection and radiation therapy Oncology history below Recent CT scan with enlargement/recurrence in the same area along with the supraclavicular area.  PET scan consistent with the same findings  - We reviewed the PET scan findings together.  Discussed the possibility of recurrence of lymphoma and further workup that is necessary. - Discussed importance of biopsy to have a diagnosis confirmation along with ruling out transformation. - Will reach out to general surgery to see if the supraclavicular area can be done as an excisional biopsy.  If it is not possible, we will consider IR guided biopsy of the left perinephric lesion.  Return to clinic after biopsy to discuss results and further management     The patient understands the plans discussed today and is in agreement with them.  He knows to contact our office if he develops concerns prior to his next appointment.  30 minutes of total time was spent for this patient encounter, including preparation,review of records,  face-to-face counseling with the patient and coordination of care, physical exam, and documentation of the encounter.    Nicholas Pope,acting as a neurosurgeon for Nicholas Dry, MD.,have documented all relevant documentation on the behalf of Nicholas Dry, MD,as directed by  Nicholas Dry, MD while in the presence of Nicholas Dry, MD.  I, Nicholas Dry MD, have reviewed the above documentation for  accuracy and completeness, and I agree with the above.    Nicholas Dry, MD   C[facility] [place] - A DEPT OF [facility] [address] [place] [address] Dept: [phone] Dept Fax: [phone]   Orders Placed This Encounter  Procedures   CT BIOPSY    If unable to reach pararenal LNs may bx soft tissue nodule in the high right back superior to the clavicle    Standing Status:   Future    Expected Date:   10/23/2024    Expiration Date:   10/16/2025    Lab orders requested (DO NOT place separate lab orders, these will be automatically ordered during procedure specimen collection)::   Surgical Pathology    Reason for Exam (SYMPTOM  OR DIAGNOSIS REQUIRED):   left pararenal LN bx    Preferred location?:   [facility]     ONCOLOGY HISTORY:   I have reviewed his chart and materials related to his cancer extensively and collaborated history with the patient. Summary of oncologic history is as follows:   Diagnosis: Stage II marginal zone lymphoma   -11/07/2016: Left perirenal mass biopsy.  Pathology: Atypical lymphoid proliferation suspicious for lymphoma. Flow cytometry showed a predominance of B cells with no monoclonality or abnormal phenotype. Minor T cell population with relative abundance of CD4 positive cells (nonspecific).  -12/05/2016: Initial PET: Hypermetabolic soft tissue in the left perirenal space is consistent with lymphoma. Second small nodule in the retroperitoneum inferior to the lower pole of the LEFT kidney is consistent with lymphoma.  Small round lesion adjacent to the RIGHT kidney is not hypermetabolic. Small LEFT lower lobe pulmonary nodule is likely benign. No hypermetabolic or enlarged abdominal, iliac or pelvic lymph nodes. No thoracic adenopathy or neck adenopathy. -01/08/2017: Laparoscopic excision of left perinephric mass.  Pathology: Non-Hodgkin's B cell lymphoma, favor marginal zone type  with prominent colonization of lymphoid follicles rather than follicular lymphoma. Low grade. -2018-08/2018: Stable disease per imaging -09/04/2018: CT CAP: Dominant right lower perinephric 1.8 cm nodule has increased in size from 1.1 cm on 02/26/2018 PET-CT, cannot exclude enlarging right perinephric lymphoma. Otherwise, stable disease.  -09/03/2019: CT CAP: There are multiple small soft tissue nodules within the retroperitoneal fat, the largest about the inferior pole of the right kidney measuring 2.0 x 1.8 cm, increased in size over sequential prior examinations, previously 1.5 x 1.4 cm when measured similarly (series 2, image 81). Additional smaller nodules are not definitively changed in size on comparison to immediate prior examination although appear to be very gradually increasing in size over time.  -03/05/2020: CT CAP: Continued further progression of the ill-defined soft tissue nodule in the right retroperitoneal space adjacent to the lower pole right kidney, now measuring 2.7 x 2.3 cm. Other scattered retroperitoneal soft tissue nodules in the pelvis are stable in the interval. Upper normal mediastinal lymph nodes and borderline increased axillary lymph nodes are similar to prior. Stable 10 mm parahilar right lower lobe pulmonary nodule. -03/24/2020-03/25/2020: Radiation therapy to the right perinephric region -08/2020-08/2021: Stable disease per imaging -08/2022-08/2024: Slight interval enlargement of left-sided perirenal soft tissue nodules and amorphous soft tissue along the inferior pole of left kidney, consistent with worsened lymphoma per imaging.  -09/22/2024: CT CAP: Increased size of a paraesophageal lymph node now measuring 9 mm in short axis. Slightly increased size of left retroperitoneal soft tissue nodularity. Similar left juxta cortical renal soft tissue. Stable subcentimeter retroperitoneal lymph nodes. New 3 mm solid left upper lobe pulmonary nodule, nonspecific. -10/09/2024:  PET: Findings most consistent with lymphoma recurrence primarily within the left pararenal space with multiple soft tissue nodules stable in size but with significant metabolic activity. Additional hypermetabolic soft tissue nodule in the high right back superior to the clavicle, concerning for additional site of disease.   Current Treatment:  Surveillance  INTERVAL HISTORY:   Ardon K Lattin is here today for follow up and to establish care with me for Marginal Zone Lymphoma. Patient is accompanied by his wife. He reports doing well overall and reports no complaints today. We discussed a possible new site of disease seen on his recent PET scan and getting a biopsy to determine if his lymphoma has transformed. He denies any abdominal pain, fevers, chills, night sweats, weight loss, or loss of appetite.   Ermin and his wife are going out of town from 10/23/2024 to 10/27/2024 and would like to have his biopsy scheduled after this.   He lives in [place], with his wife. He is a retired consulting civil engineer.   I have reviewed the past medical history, past surgical history, social history and family history with the patient and they are unchanged from previous note.  ALLERGIES:  is allergic to niacin and related.  MEDICATIONS:  Current Outpatient Medications  Medication Sig Dispense Refill   amLODipine (NORVASC) 10 MG tablet Take 10 mg by mouth daily.     aspirin 325 MG tablet Take 325 mg by mouth daily.     fluticasone  (FLONASE ) 50 MCG/ACT nasal spray Place 2 sprays into both nostrils daily.  lisinopril (ZESTRIL) 40 MG tablet Take 40 mg by mouth daily.     loratadine  (CLARITIN ) 10 MG tablet Take 10 mg by mouth daily.     metFORMIN (GLUCOPHAGE) 500 MG tablet      Multiple Vitamin (MULTIVITAMIN WITH MINERALS) TABS tablet Take 1 tablet by mouth at bedtime. Men's One-A-Day     omeprazole (PRILOSEC) 40 MG capsule Take 40 mg by mouth at bedtime.      RYBELSUS 14 MG TABS Take 1 tablet by mouth  daily.     No current facility-administered medications for this visit.    REVIEW OF SYSTEMS:   Constitutional: Denies fevers, chills or abnormal weight loss Eyes: Denies blurriness of vision Ears, nose, mouth, throat, and face: Denies mucositis or sore throat Respiratory: Denies cough, dyspnea or wheezes Cardiovascular: Denies palpitation, chest discomfort or lower extremity swelling Gastrointestinal:  Denies nausea, heartburn or change in bowel habits Skin: Denies abnormal skin rashes Lymphatics: Denies new lymphadenopathy or easy bruising Neurological:Denies numbness, tingling or new weaknesses Behavioral/Psych: Mood is stable, no new changes  All other systems were reviewed with the patient and are negative.   VITALS:  Blood pressure 130/83, pulse 86, temperature 97.7 F (36.5 C), temperature source Oral, resp. rate 16, weight 240 lb 8.4 oz (109.1 kg), SpO2 98%.  Wt Readings from Last 3 Encounters:  10/16/24 240 lb 8.4 oz (109.1 kg)  09/29/24 239 lb (108.4 kg)  03/20/24 240 lb 15.4 oz (109.3 kg)    Body mass index is 32.62 kg/m.  Performance status (ECOG): 1 - Symptomatic but completely ambulatory  PHYSICAL EXAM:   GENERAL:alert, no distress and comfortable SKIN: skin color, texture, turgor are normal, no rashes or significant lesions NECK: supple, thyroid normal size, non-tender, without nodularity LYMPH: Right supraclavicular nodule palpated, 3 x 4 cms. No other lymphadenopathy palpated.  LUNGS: clear to auscultation and percussion with normal breathing effort HEART: regular rate & rhythm and no murmurs and no lower extremity edema ABDOMEN:abdomen soft, non-tender and normal bowel sounds Musculoskeletal:no cyanosis of digits and no clubbing  NEURO: alert & oriented x 3 with fluent speech  LABORATORY DATA:  I have reviewed the data as listed   Lab Results  Component Value Date   WBC 6.3 09/22/2024   NEUTROABS 3.6 09/22/2024   HGB 14.5 09/22/2024   HCT 42.1  09/22/2024   MCV 84.0 09/22/2024   PLT 221 09/22/2024      Chemistry      Component Value Date/Time   NA 137 09/22/2024 0758   NA 139 10/26/2017 0741   K 4.0 09/22/2024 0758   K 4.3 10/26/2017 0741   CL 102 09/22/2024 0758   CO2 24 09/22/2024 0758   CO2 25 10/26/2017 0741   BUN 17 09/22/2024 0758   BUN 13.5 10/26/2017 0741   CREATININE 0.78 09/22/2024 0758   CREATININE 0.83 09/08/2022 0733   CREATININE 0.8 10/26/2017 0741      Component Value Date/Time   CALCIUM 9.1 09/22/2024 0758   CALCIUM 9.0 10/26/2017 0741   ALKPHOS 82 09/22/2024 0758   ALKPHOS 87 10/26/2017 0741   AST 30 09/22/2024 0758   AST 51 (H) 09/08/2022 0733   AST 62 (H) 10/26/2017 0741   ALT 45 (H) 09/22/2024 0758   ALT 63 (H) 09/08/2022 0733   ALT 116 (H) 10/26/2017 0741   BILITOT 0.3 09/22/2024 0758   BILITOT 0.5 09/08/2022 0733   BILITOT 0.64 10/26/2017 0741       Latest Reference Range & Units 09/22/24  07:58  LDH 98 - 192 U/L 185   RADIOGRAPHIC STUDIES: I have personally reviewed the radiological images as listed and agreed with the findings in the report.  NM PET Image Restag (PS) Skull Base To Thigh EXAM: PET AND CT SKULL BASE TO MID THIGH 10/09/2024 11:49:57 AM  TECHNIQUE:  RADIOPHARMACEUTICAL: 11.93 mCi F-18 FDG Glucose level 142 mg/dl.  PET imaging was acquired from the base of the skull to the mid thighs. Non-contrast enhanced computed tomography was obtained for attenuation correction and anatomic localization.  COMPARISON: CT 09/22/2024.  CLINICAL HISTORY: Marginal zone lymphoma, restaging. F-18 FDG 11.35mCi, FBG 142mg /dl, Wt 760oad, Marginal zone lymphoma restaging, diabetic, scanned 62 minutes post injection, injection site LAC.  FINDINGS:  HEAD AND NECK: No metabolically active cervical lymphadenopathy.  CHEST: The paraesophageal lymph node described on comparison CT is small, measuring 9 mm on image 76, and without significant metabolic activity. No  suspicious pulmonary nodules are identified. No metabolically active lymphadenopathy.  ABDOMEN AND PELVIS: Within the retroperitoneal space medial to the left kidney, there are three soft tissue nodules that have associated metabolic activity. For example, lesion on image 112 measures 21 mm with a SUV max equal to 4.6. A similar lesion is on image 108 with a SUV max equal to 4.1. Soft tissue lateral to the left kidney is hypermetabolic with a SUV max equal to 6.1 on image 120. This soft tissue is better characterized on comparison CT with contrast. These retroperitoneal nodules in the left pararenal space are not changed significantly in size from comparison to CT scans. A tiny focus of metabolic activity is in the most inferior retroperitoneal space along the left iliac fossa, measuring 8 mm on image 154 with faint metabolic activity. No abnormality is within the solid organs. Physiologic activity within the gastrointestinal and genitourinary systems.  BONES AND SOFT TISSUE: There is a soft tissue nodule within the fat between the musculature in the high right back superior to the clavicle, measuring 15 mm on image 48, which has associated metabolic activity with a SUV max equal to 2.5. No skeletal metastases are identified.  IMPRESSION: 1. Findings most consistent with lymphoma recurrence primarily within the left pararenal space with multiple soft tissue nodules stable in size but with significant metabolic activity. 2. Additional hypermetabolic soft tissue nodule in the high right back superior to the clavicle, concerning for additional site of disease.  Electronically signed by: Norleen Boxer MD 10/11/2024 01:12 PM EDT RP Workstation: [id number]

## 2024-10-17 ENCOUNTER — Encounter: Payer: Self-pay | Admitting: Radiology

## 2024-10-17 NOTE — Progress Notes (Signed)
 [name], MD  Nicholas Pope, RT PROCEDURE / BIOPSY REVIEW Date: 10/17/24  Requested Biopsy site: left perirenal nodule Reason for request: Enlarging tissue, known lymphoma Imaging review: Best seen on CT AP 10/6.  Left perirenal soft tissue on image 61, seq 2.    Decision: Approved Imaging modality to perform: CT Schedule with: Moderate Sedation Schedule for:  Additional comments: Would start with patient in left lateral decubitus.  Target the soft tissue separate from left kidney if possible because it has grown  Please contact me with questions, concerns, or if issue pertaining to this request arise.  [name], MD [facility]       Previous Messages    ----- Message ----- From: Nicholas Pope, RT Sent: 10/16/2024   2:59 PM EDT To: Ir Procedure Requests Subject: CT BIOPSY                                      Procedure :CT BIOPSY  Reason :left pararenal LN bx Dx: Marginal zone lymphoma (HCC) [C85.80 (ICD-10-CM)]    History :NM PET Image Restag (PS) Skull Base To Thigh (Accession [id number]) (Order [id number]), CT CHEST ABDOMEN PELVIS W CONTRAST (Accession [id number]) (Order [id number]),  Provider:left pararenal LN bx Dx: Marginal zone lymphoma (HCC) [C85.80 (ICD-10-CM)]    Provider contact ; [phone]

## 2024-11-07 ENCOUNTER — Other Ambulatory Visit: Payer: Self-pay

## 2024-11-07 DIAGNOSIS — Z01818 Encounter for other preprocedural examination: Secondary | ICD-10-CM

## 2024-11-10 ENCOUNTER — Other Ambulatory Visit: Payer: Self-pay

## 2024-11-10 ENCOUNTER — Ambulatory Visit (HOSPITAL_COMMUNITY)
Admission: RE | Admit: 2024-11-10 | Discharge: 2024-11-10 | Disposition: A | Discharge status: Home / Self Care | Source: Ambulatory Visit | Attending: Oncology | Admitting: Oncology

## 2024-11-10 ENCOUNTER — Encounter (HOSPITAL_COMMUNITY): Payer: Self-pay

## 2024-11-10 DIAGNOSIS — C8580 Other specified types of non-Hodgkin lymphoma, unspecified site: Secondary | ICD-10-CM

## 2024-11-10 DIAGNOSIS — Z01818 Encounter for other preprocedural examination: Secondary | ICD-10-CM

## 2024-11-10 LAB — CBC
HCT: 41.6 % (ref 39.0–52.0)
Hemoglobin: 14.5 g/dL (ref 13.0–17.0)
MCH: 29.1 pg (ref 26.0–34.0)
MCHC: 34.9 g/dL (ref 30.0–36.0)
MCV: 83.5 fL (ref 80.0–100.0)
Platelets: 246 K/uL (ref 150–400)
RBC: 4.98 MIL/uL (ref 4.22–5.81)
RDW: 12.3 % (ref 11.5–15.5)
WBC: 6.3 K/uL (ref 4.0–10.5)
nRBC: 0 % (ref 0.0–0.2)

## 2024-11-10 LAB — PROTIME-INR
INR: 1 (ref 0.8–1.2)
Prothrombin Time: 14.1 s (ref 11.4–15.2)

## 2024-11-10 LAB — GLUCOSE, CAPILLARY: Glucose-Capillary: 124 mg/dL — ABNORMAL HIGH (ref 70–99)

## 2024-11-10 MED ORDER — LIDOCAINE 1 % OPTIME INJ - NO CHARGE
10.0000 mL | Freq: Once | INTRAMUSCULAR | Status: AC
  Administered 2024-11-10: 10 mL
  Filled 2024-11-10: qty 10

## 2024-11-10 MED ORDER — MIDAZOLAM HCL 2 MG/2ML IJ SOLN
INTRAMUSCULAR | Status: AC
  Filled 2024-11-10: qty 4

## 2024-11-10 MED ORDER — FENTANYL CITRATE (PF) 100 MCG/2ML IJ SOLN
INTRAMUSCULAR | Status: AC | PRN
  Administered 2024-11-10: 25 ug via INTRAVENOUS
  Administered 2024-11-10: 50 ug via INTRAVENOUS
  Administered 2024-11-10: 25 ug via INTRAVENOUS

## 2024-11-10 MED ORDER — FENTANYL CITRATE (PF) 100 MCG/2ML IJ SOLN
INTRAMUSCULAR | Status: AC
  Filled 2024-11-10: qty 4

## 2024-11-10 MED ORDER — SODIUM CHLORIDE 0.9 % IV SOLN: INTRAVENOUS | Status: DC

## 2024-11-10 MED ORDER — MIDAZOLAM HCL (PF) 2 MG/2ML IJ SOLN
INTRAMUSCULAR | Status: AC | PRN
  Administered 2024-11-10 (×2): 1 mg via INTRAVENOUS

## 2024-11-10 MED ORDER — HYDROCODONE-ACETAMINOPHEN 5-325 MG PO TABS: 1.0000 | ORAL_TABLET | ORAL | Status: DC | PRN

## 2024-11-10 NOTE — Progress Notes (Signed)
 Patient and wife was given discharge instructions. Both verbalized understanding.

## 2024-11-10 NOTE — Procedures (Signed)
  Procedure:  CT core biopsy left perirenal mass 18g x3 Preprocedure diagnosis: Diagnoses of Marginal zone lymphoma (HCC) and Preop testing were pertinent to this visit. Postprocedure diagnosis: same EBL:    minimal Complications:   none immediate  See full dictation in [employer].  CHARM Toribio Faes MD Main # [phone] Pager  [phone] Mobile [phone]

## 2024-11-10 NOTE — H&P (Signed)
 Chief Complaint: Patient was seen in consultation today for enlarging soft tissue mass in the setting of known lymphoma  Referring Physician(s): Nicholas Pope  Supervising Physician: Nicholas Pope  Patient Status: [facility] - Out-pt  History of Present Illness: Nicholas Pope is a 63 y.o. male with stage II marginal zone lymphoma diagnosed in 2017 s/p robot-assisted laparoscopic excision of a left perinephric mass positive for non-hodgkin's lymphoma. Patient has been stable since undergoing treatment, however recent imaging in 2024 and again in 2025 showed small interval enlargement of a left pararenal soft tissue mass.  IR consulted for biopsy at the request of Dr. Davonna.  Case reviewed and approved by Dr. Philip.   Nicholas Pope presents to [facility] today for biopsy in his usual state of health.  He is aware of goals of the procedure.  His wife is available for post-procedure care.    He is full code for procedural purposes today.   Past Medical History:  Diagnosis Date   Cancer (HCC) 2018   non-Hodgins lymphoma    Diabetes mellitus without complication (HCC)    GERD (gastroesophageal reflux disease)    History of bronchitis as a child    Pneumonia    hx of    PONV (postoperative nausea and vomiting)     Past Surgical History:  Procedure Laterality Date   COLONOSCOPY  05/2012   [place]   KNEE ARTHROPLASTY Left 1980's   torn cartilage    LYMPH NODE BIOPSY     POLYPECTOMY     ROBOTIC ASSITED PARTIAL NEPHRECTOMY Left 01/08/2017   Procedure: XI ROBOTIC ASSITED RESECTION OF PERINEPHRIC MASS;  Surgeon: Nicholas Ferrara, MD;  Location: [facility] ORS;  Service: Urology;  Laterality: Left;   UMBILICAL HERNIA REPAIR     as a child    Allergies: Niacin and related  Medications: Prior to Admission medications   Medication Sig Start Date End Date Taking? Authorizing Provider  amLODipine (NORVASC) 10 MG tablet Take 10 mg by mouth daily. 01/16/22  Yes [provider]   fluticasone  (FLONASE ) 50 MCG/ACT nasal spray Place 2 sprays into both nostrils daily.   Yes [provider]  lisinopril (ZESTRIL) 40 MG tablet Take 40 mg by mouth daily.   Yes [provider]  loratadine  (CLARITIN ) 10 MG tablet Take 10 mg by mouth daily.   Yes [provider]  Multiple Vitamin (MULTIVITAMIN WITH MINERALS) TABS tablet Take 1 tablet by mouth at bedtime. Men's One-A-Day   Yes [provider]  omeprazole (PRILOSEC) 40 MG capsule Take 40 mg by mouth at bedtime.    Yes [provider]  RYBELSUS 14 MG TABS Take 1 tablet by mouth daily. 09/10/24  Yes [provider]  aspirin 325 MG tablet Take 325 mg by mouth daily.    [provider]  metFORMIN (GLUCOPHAGE) 500 MG tablet  11/15/19   [provider]     Family History  Problem Relation Age of Onset   Colon cancer Neg Hx    Colon polyps Neg Hx    Esophageal cancer Neg Hx    Rectal cancer Neg Hx    Stomach cancer Neg Hx     Social History   Socioeconomic History   Marital status: Married    Spouse name: Not on file   Number of children: Not on file   Years of education: Not on file   Highest education level: Not on file  Occupational History   Not on file  Tobacco Use  Smoking status: Never   Smokeless tobacco: Former    Types: Chew    Quit date: 2018  Vaping Use   Vaping status: Never Used  Substance and Sexual Activity   Alcohol use: No   Drug use: No   Sexual activity: Not on file  Other Topics Concern   Not on file  Social History Narrative   Not on file   Social Drivers of Health   Financial Resource Strain: Not on file  Food Insecurity: No Food Insecurity (03/15/2023)   Hunger Vital Sign    Worried About Running Out of Food in the Last Year: Never true    Ran Out of Food in the Last Year: Never true  Transportation Needs: No Transportation Needs (03/15/2023)   PRAPARE - Administrator, Civil Service (Medical): No     Lack of Transportation (Non-Medical): No  Physical Activity: Not on file  Stress: Not on file  Social Connections: Not on file     Review of Systems: A 12 point ROS discussed and pertinent positives are indicated in the HPI above.  All other systems are negative.  Review of Systems  Constitutional:  Negative for fatigue and fever.  Respiratory:  Negative for cough and shortness of breath.   Cardiovascular:  Negative for chest pain.  Gastrointestinal:  Negative for abdominal pain, nausea and vomiting.  Musculoskeletal:  Negative for back pain.  Psychiatric/Behavioral:  Negative for behavioral problems and confusion.     Vital Signs: BP 131/82   Pulse 79   Temp 98 F (36.7 C) (Oral)   Ht 6' (1.829 m)   Wt 240 lb (108.9 kg)   SpO2 97%   BMI 32.55 kg/m   Physical Exam Vitals and nursing note reviewed.  Constitutional:      General: He is not in acute distress.    Appearance: Normal appearance. He is not ill-appearing.  Cardiovascular:     Rate and Rhythm: Normal rate and regular rhythm.  Pulmonary:     Effort: Pulmonary effort is normal. No respiratory distress.  Abdominal:     General: Abdomen is flat. There is no distension.  Skin:    General: Skin is warm and dry.  Neurological:     General: No focal deficit present.     Mental Status: He is alert and oriented to person, place, and time. Mental status is at baseline.  Psychiatric:        Mood and Affect: Mood normal.        Behavior: Behavior normal.        Thought Content: Thought content normal.        Judgment: Judgment normal.      MD Evaluation Airway: WNL Heart: WNL Abdomen: WNL Chest/ Lungs: WNL ASA  Classification: 3 Mallampati/Airway Score: Two   Imaging: No results found.  Labs:  CBC: Recent Labs    03/20/24 1341 09/22/24 0758 11/10/24 0910  WBC 7.6 6.3 6.3  HGB 13.8 14.5 14.5  HCT 39.7 42.1 41.6  PLT 258 221 246    COAGS: Recent Labs    11/10/24 0853  INR 1.0     BMP: Recent Labs    03/20/24 1341 09/22/24 0758  NA 135 137  K 3.8 4.0  CL 102 102  CO2 22 24  GLUCOSE 112* 134*  BUN 18 17  CALCIUM 9.0 9.1  CREATININE 0.90 0.78  GFRNONAA >60 >60    LIVER FUNCTION TESTS: Recent Labs    03/20/24 1341 09/22/24 0758  BILITOT 0.5 0.3  AST 36 30  ALT 44 45*  ALKPHOS 76 82  PROT 7.4 7.2  ALBUMIN 4.1 4.5    TUMOR MARKERS: No results for input(s): AFPTM, CEA, CA199, CHROMGRNA in the last 8760 hours.  Assessment and Plan: Patient with past medical history of known lymphoma presents with complaint of enlarging soft tissue mass left of the kidney.  IR consulted for biopsy at the request of Dr. Davonna. Case reviewed by Dr. Philip who approves patient for procedure.  Patient presents today in their usual state of health.  He has been NPO and is not currently on blood thinners.   Risks and benefits of biopsy was discussed with the patient and/or patient's family including, but not limited to bleeding, infection, damage to adjacent structures or low yield requiring additional tests.  All of the questions were answered and there is agreement to proceed.  Consent signed and in chart.    Thank you for this interesting consult.  I greatly enjoyed meeting Nicholas Pope and look forward to participating in their care.  A copy of this report was sent to the requesting provider on this date.  Electronically Signed: Jamariya Davidoff Sue-Ellen Praveen Coia, PA 11/10/2024, 9:48 AM   I spent a total of  30 Minutes   in face to face in clinical consultation, greater than 50% of which was counseling/coordinating care for enlarging soft tissue mass.

## 2024-11-12 LAB — SURGICAL PATHOLOGY

## 2024-11-17 LAB — SURGICAL PATHOLOGY

## 2024-12-03 ENCOUNTER — Inpatient Hospital Stay: Attending: Physician Assistant | Admitting: Oncology

## 2024-12-03 VITALS — BP 110/76 | HR 99 | Temp 97.6°F | Resp 20 | Wt 240.8 lb

## 2024-12-03 DIAGNOSIS — C8580 Other specified types of non-Hodgkin lymphoma, unspecified site: Secondary | ICD-10-CM | POA: Diagnosis not present

## 2024-12-03 DIAGNOSIS — Z79899 Other long term (current) drug therapy: Secondary | ICD-10-CM | POA: Diagnosis not present

## 2024-12-03 DIAGNOSIS — Z7982 Long term (current) use of aspirin: Secondary | ICD-10-CM | POA: Diagnosis not present

## 2024-12-03 DIAGNOSIS — E119 Type 2 diabetes mellitus without complications: Secondary | ICD-10-CM | POA: Diagnosis not present

## 2024-12-03 DIAGNOSIS — C884 Extranodal marginal zone b-cell lymphoma of mucosa-associated lymphoid tissue (malt-lymphoma) not having achieved remission: Secondary | ICD-10-CM | POA: Insufficient documentation

## 2024-12-03 DIAGNOSIS — R911 Solitary pulmonary nodule: Secondary | ICD-10-CM | POA: Insufficient documentation

## 2024-12-03 NOTE — Patient Instructions (Addendum)
 [facility] at [facility] Discharge Instructions   You were seen and examined today by Dr. Davonna.  She reviewed the results of your lab work which are normal/stable.   We will see you back in 3 months. We will repeat blood work prior to this visit.    Return as scheduled.    Thank you for choosing [facility] at [facility] to provide your oncology and hematology care.  To afford each patient quality time with our provider, please arrive at least 15 minutes before your scheduled appointment time.   If you have a lab appointment with the Cancer Center please come in thru the Main Entrance and check in at the main information desk.  You need to re-schedule your appointment should you arrive 10 or more minutes late.  We strive to give you quality time with our providers, and arriving late affects you and other patients whose appointments are after yours.  Also, if you no show three or more times for appointments you may be dismissed from the clinic at the providers discretion.     Again, thank you for choosing [facility].  Our hope is that these requests will decrease the amount of time that you wait before being seen by our physicians.       _____________________________________________________________  Should you have questions after your visit to [facility], please contact our office at [phone] and follow the prompts.  Our office hours are 8:00 a.m. and 4:30 p.m. Monday - Friday.  Please note that voicemails left after 4:00 p.m. may not be returned until the following business day.  We are closed weekends and major holidays.  You do have access to a nurse 24-7, just call the main number to the clinic [phone] and do not press any options, hold on the line and a nurse will answer the phone.    For prescription refill requests, have your pharmacy contact our office and allow 72 hours.    Due to Covid, you  will need to wear a mask upon entering the hospital. If you do not have a mask, a mask will be given to you at the Main Entrance upon arrival. For doctor visits, patients may have 1 support person age 91 or older with them. For treatment visits, patients can not have anyone with them due to social distancing guidelines and our immunocompromised population.

## 2024-12-03 NOTE — Progress Notes (Unsigned)
 Patient Care Team: Lari Elspeth BRAVO, MD as PCP - General (Family Medicine)  Clinic Day:  10/16/2024  Referring physician: Lari Elspeth BRAVO, MD   CHIEF COMPLAINT:  CC: Stage II marginal zone lymphoma    ASSESSMENT & PLAN:   Assessment & Plan: Nicholas Pope  is a 63 y.o. male with marginal zone lymphoma  Marginal Zone lymphoma Marginal zone lymphoma initially diagnosed in left perinephric space in 2017 s/p resection and radiation therapy Oncology history below Recent CT scan with enlargement/recurrence in the same area along with the supraclavicular area.  PET scan consistent with the same findings  - We reviewed the PET scan findings together.  Discussed the possibility of recurrence of lymphoma and further workup that is necessary. - Discussed importance of biopsy to have a diagnosis confirmation along with ruling out transformation. - Will reach out to general surgery to see if the supraclavicular area can be done as an excisional biopsy.  If it is not possible, we will consider IR guided biopsy of the left perinephric lesion.  Return to clinic after biopsy to discuss results and further management     The patient understands the plans discussed today and is in agreement with them.  He knows to contact our office if he develops concerns prior to his next appointment.  30 minutes of total time was spent for this patient encounter, including preparation,review of records,  face-to-face counseling with the patient and coordination of care, physical exam, and documentation of the encounter.    Nicholas Dry, MD  [facility] - A DEPT OF [facility] [address] [place] [address] Dept: [phone] Dept Fax: [phone]   No orders of the defined types were placed in this encounter.    ONCOLOGY HISTORY:   I have reviewed his chart and materials related to his cancer extensively and collaborated  history with the patient. Summary of oncologic history is as follows:   Diagnosis: Stage II marginal zone lymphoma   -11/07/2016: Left perirenal mass biopsy.  Pathology: Atypical lymphoid proliferation suspicious for lymphoma. Flow cytometry showed a predominance of B cells with no monoclonality or abnormal phenotype. Minor T cell population with relative abundance of CD4 positive cells (nonspecific).  -12/05/2016: Initial PET: Hypermetabolic soft tissue in the left perirenal space is consistent with lymphoma. Second small nodule in the retroperitoneum inferior to the lower pole of the LEFT kidney is consistent with lymphoma. Small round lesion adjacent to the RIGHT kidney is not hypermetabolic. Small LEFT lower lobe pulmonary nodule is likely benign. No hypermetabolic or enlarged abdominal, iliac or pelvic lymph nodes. No thoracic adenopathy or neck adenopathy. -01/08/2017: Laparoscopic excision of left perinephric mass.  Pathology: Non-Hodgkin's B cell lymphoma, favor marginal zone type with prominent colonization of lymphoid follicles rather than follicular lymphoma. Low grade. -2018-08/2018: Stable disease per imaging -09/04/2018: CT CAP: Dominant right lower perinephric 1.8 cm nodule has increased in size from 1.1 cm on 02/26/2018 PET-CT, cannot exclude enlarging right perinephric lymphoma. Otherwise, stable disease.  -09/03/2019: CT CAP: There are multiple small soft tissue nodules within the retroperitoneal fat, the largest about the inferior pole of the right kidney measuring 2.0 x 1.8 cm, increased in size over sequential prior examinations, previously 1.5 x 1.4 cm when measured similarly (series 2, image 81). Additional smaller nodules are not definitively changed in size on comparison to immediate prior examination although appear to be very gradually increasing in size over time.  -  03/05/2020: CT CAP: Continued further progression of the ill-defined soft tissue nodule in the right  retroperitoneal space adjacent to the lower pole right kidney, now measuring 2.7 x 2.3 cm. Other scattered retroperitoneal soft tissue nodules in the pelvis are stable in the interval. Upper normal mediastinal lymph nodes and borderline increased axillary lymph nodes are similar to prior. Stable 10 mm parahilar right lower lobe pulmonary nodule. -03/24/2020-03/25/2020: Radiation therapy to the right perinephric region -08/2020-08/2021: Stable disease per imaging -08/2022-08/2024: Slight interval enlargement of left-sided perirenal soft tissue nodules and amorphous soft tissue along the inferior pole of left kidney, consistent with worsened lymphoma per imaging.  -09/22/2024: CT CAP: Increased size of a paraesophageal lymph node now measuring 9 mm in short axis. Slightly increased size of left retroperitoneal soft tissue nodularity. Similar left juxta cortical renal soft tissue. Stable subcentimeter retroperitoneal lymph nodes. New 3 mm solid left upper lobe pulmonary nodule, nonspecific. -10/09/2024: PET: Findings most consistent with lymphoma recurrence primarily within the left pararenal space with multiple soft tissue nodules stable in size but with significant metabolic activity. Additional hypermetabolic soft tissue nodule in the high right back superior to the clavicle, concerning for additional site of disease.   Current Treatment:  Surveillance  INTERVAL HISTORY:   Discussed the use of AI scribe software for clinical note transcription with the patient, who gave verbal consent to proceed.  History of Present Illness Nicholas Pope is a 63 year old male with indolent marginal zone lymphoma who presents for oncology follow-up to review recent biopsy results and disease status.  He has multifocal marginal zone lymphoma, initially identified in a left-sided lymph node in 2017 without treatment. In 2021, a new right-sided lesion was treated with radiation therapy. Recently, a new left-sided  lesion was excised and biopsy confirmed marginal zone lymphoma, consistent with prior pathology. Flow cytometry was inconclusive due to insufficient cells, but histopathology was diagnostic. Prior imaging has shown a 1.5 cm supraclavicular lymph node and three nodules near the left kidney. He previously believed there were only two new sites, but review clarified three nodules in the kidney area in addition to the supraclavicular node.  He remains asymptomatic, denying fever, chills, night sweats, weight loss, anorexia, or significant fatigue. He has never experienced pain related to lymphoma and reports stable appetite and weight. He states, I don't feel bad. I don't hurt. Today no more than a regular day. His nurse notes he is just not peppy today like he normally is due to a cold or sinus symptoms. He denies abnormal bleeding or bruising. He is not currently taking any medications for lymphoma and has not received chemotherapy.    I have reviewed the past medical history, past surgical history, social history and family history with the patient and they are unchanged from previous note.  ALLERGIES:  is allergic to niacin and related.  MEDICATIONS:  Current Outpatient Medications  Medication Sig Dispense Refill   amLODipine (NORVASC) 10 MG tablet Take 10 mg by mouth daily.     aspirin 325 MG tablet Take 325 mg by mouth daily.     fluticasone  (FLONASE ) 50 MCG/ACT nasal spray Place 2 sprays into both nostrils daily.     lisinopril (ZESTRIL) 40 MG tablet Take 40 mg by mouth daily.     loratadine  (CLARITIN ) 10 MG tablet Take 10 mg by mouth daily.     metFORMIN (GLUCOPHAGE) 500 MG tablet      Multiple Vitamin (MULTIVITAMIN WITH MINERALS) TABS tablet Take 1 tablet by mouth at  bedtime. Men's One-A-Day     omeprazole (PRILOSEC) 40 MG capsule Take 40 mg by mouth at bedtime.      RYBELSUS 14 MG TABS Take 1 tablet by mouth daily.     No current facility-administered medications for this visit.     REVIEW OF SYSTEMS:   Constitutional: Denies fevers, chills or abnormal weight loss Eyes: Denies blurriness of vision Ears, nose, mouth, throat, and face: Denies mucositis or sore throat Respiratory: Denies cough, dyspnea or wheezes Cardiovascular: Denies palpitation, chest discomfort or lower extremity swelling Gastrointestinal:  Denies nausea, heartburn or change in bowel habits Skin: Denies abnormal skin rashes Lymphatics: Denies new lymphadenopathy or easy bruising Neurological:Denies numbness, tingling or new weaknesses Behavioral/Psych: Mood is stable, no new changes  All other systems were reviewed with the patient and are negative.   VITALS:  There were no vitals taken for this visit.  Wt Readings from Last 3 Encounters:  11/10/24 240 lb (108.9 kg)  10/16/24 240 lb 8.4 oz (109.1 kg)  09/29/24 239 lb (108.4 kg)    There is no height or weight on file to calculate BMI.  Performance status (ECOG): 1 - Symptomatic but completely ambulatory  PHYSICAL EXAM:   GENERAL:alert, no distress and comfortable SKIN: skin color, texture, turgor are normal, no rashes or significant lesions NECK: supple, thyroid normal size, non-tender, without nodularity LYMPH: Right supraclavicular nodule palpated, 3 x 4 cms. No other lymphadenopathy palpated.  LUNGS: clear to auscultation and percussion with normal breathing effort HEART: regular rate & rhythm and no murmurs and no lower extremity edema ABDOMEN:abdomen soft, non-tender and normal bowel sounds Musculoskeletal:no cyanosis of digits and no clubbing  NEURO: alert & oriented x 3 with fluent speech  LABORATORY DATA:  I have reviewed the data as listed   Lab Results  Component Value Date   WBC 6.3 11/10/2024   NEUTROABS 3.6 09/22/2024   HGB 14.5 11/10/2024   HCT 41.6 11/10/2024   MCV 83.5 11/10/2024   PLT 246 11/10/2024      Chemistry      Component Value Date/Time   NA 137 09/22/2024 0758   NA 139 10/26/2017 0741   K  4.0 09/22/2024 0758   K 4.3 10/26/2017 0741   CL 102 09/22/2024 0758   CO2 24 09/22/2024 0758   CO2 25 10/26/2017 0741   BUN 17 09/22/2024 0758   BUN 13.5 10/26/2017 0741   CREATININE 0.78 09/22/2024 0758   CREATININE 0.83 09/08/2022 0733   CREATININE 0.8 10/26/2017 0741      Component Value Date/Time   CALCIUM 9.1 09/22/2024 0758   CALCIUM 9.0 10/26/2017 0741   ALKPHOS 82 09/22/2024 0758   ALKPHOS 87 10/26/2017 0741   AST 30 09/22/2024 0758   AST 51 (H) 09/08/2022 0733   AST 62 (H) 10/26/2017 0741   ALT 45 (H) 09/22/2024 0758   ALT 63 (H) 09/08/2022 0733   ALT 116 (H) 10/26/2017 0741   BILITOT 0.3 09/22/2024 0758   BILITOT 0.5 09/08/2022 0733   BILITOT 0.64 10/26/2017 0741       Latest Reference Range & Units 09/22/24 07:58  LDH 98 - 192 U/L 185   RADIOGRAPHIC STUDIES: I have personally reviewed the radiological images as listed and agreed with the findings in the report.  CT BIOPSY CLINICAL DATA:  History of marginal zone lymphoma. Hypermetabolic left retroperitoneal masses.  EXAM: CT GUIDED CORE BIOPSY OF LEFT RETROPERITONEAL MASS  ANESTHESIA/SEDATION: Intravenous Fentanyl  100mcg and Versed  2mg  were administered by RN during  a total moderate (conscious) sedation time of 14 minutes; the patient's level of consciousness and physiological / cardiorespiratory status were monitored continuously by radiology RN under my direct supervision.  PROCEDURE: The procedure risks, benefits, and alternatives were explained to the patient. Questions regarding the procedure were encouraged and answered. The patient understands and consents to the procedure.  Patient placed prone. Select axial scans through the retroperitoneum were obtained. A representative left retroperitoneal lesion was localized, and appropriate skin entry site determined and marked.  The operative field was prepped with chlorhexidinein a sterile fashion, and a sterile drape was applied covering the  operative field. A sterile gown and sterile gloves were used for the procedure. Local anesthesia was provided with 1% Lidocaine .  Under CT fluoroscopic guidance, a 17 gauge trocar needle was advanced to the margin of the lesion. Once needle tip position was confirmed, coaxial 18-gauge core biopsy samples were obtained, submitted in saline to surgical pathology. The guide needle was removed. Postprocedure scans show no hemorrhage or other apparent complication. The patient tolerated the procedure well.  RADIATION DOSE REDUCTION: This exam was performed according to the departmental dose-optimization program which includes automated exposure control, adjustment of the mA and/or kV according to patient size and/or use of iterative reconstruction technique.  COMPLICATIONS: None immediate  FINDINGS: Scattered left retroperitoneal masses were identified corresponding to recent PET-CT. Rib solid-appearing core biopsy samples of a representative lesion were obtained, submitted in saline to surgical pathology.  IMPRESSION: 1. Technically successful CT-guided core biopsy, left retroperitoneal mass.  Electronically Signed   By: JONETTA Faes M.D.   On: 11/10/2024 17:20

## 2024-12-15 ENCOUNTER — Encounter: Payer: Self-pay | Admitting: *Deleted

## 2025-03-05 ENCOUNTER — Inpatient Hospital Stay: Attending: Physician Assistant

## 2025-03-05 ENCOUNTER — Other Ambulatory Visit (HOSPITAL_COMMUNITY)

## 2025-03-11 ENCOUNTER — Inpatient Hospital Stay: Admitting: Oncology
